# Patient Record
Sex: Female | Born: 1980 | Race: Black or African American | Hispanic: No | Marital: Single | Smoking: Current some day smoker
Health system: Southern US, Community
[De-identification: ages and names within clinical notes are randomized; demographics above are authoritative.]

## PROBLEM LIST (undated history)

## (undated) ENCOUNTER — Inpatient Hospital Stay (HOSPITAL_COMMUNITY): Payer: Self-pay

## (undated) DIAGNOSIS — A549 Gonococcal infection, unspecified: Secondary | ICD-10-CM

## (undated) DIAGNOSIS — Z789 Other specified health status: Secondary | ICD-10-CM

## (undated) DIAGNOSIS — A749 Chlamydial infection, unspecified: Secondary | ICD-10-CM

## (undated) DIAGNOSIS — B009 Herpesviral infection, unspecified: Secondary | ICD-10-CM

## (undated) HISTORY — PX: NO PAST SURGERIES: SHX2092

## (undated) HISTORY — DX: Herpesviral infection, unspecified: B00.9

## (undated) HISTORY — PX: LEG SURGERY: SHX1003

---
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
## 2005-08-29 ENCOUNTER — Inpatient Hospital Stay (HOSPITAL_COMMUNITY): Admission: EM | Admit: 2005-08-29 | Discharge: 2005-09-01 | Payer: Self-pay | Admitting: Emergency Medicine

## 2006-02-24 ENCOUNTER — Emergency Department (HOSPITAL_COMMUNITY): Admission: EM | Admit: 2006-02-24 | Discharge: 2006-02-24 | Payer: Self-pay | Admitting: Emergency Medicine

## 2006-10-29 ENCOUNTER — Emergency Department (HOSPITAL_COMMUNITY): Admission: EM | Admit: 2006-10-29 | Discharge: 2006-10-29 | Payer: Self-pay | Admitting: Emergency Medicine

## 2007-10-18 ENCOUNTER — Inpatient Hospital Stay (HOSPITAL_COMMUNITY): Admission: AD | Admit: 2007-10-18 | Discharge: 2007-10-18 | Payer: Self-pay | Admitting: Obstetrics and Gynecology

## 2008-07-08 ENCOUNTER — Inpatient Hospital Stay (HOSPITAL_COMMUNITY): Admission: AD | Admit: 2008-07-08 | Discharge: 2008-07-08 | Payer: Self-pay | Admitting: Family Medicine

## 2009-02-09 ENCOUNTER — Inpatient Hospital Stay (HOSPITAL_COMMUNITY): Admission: AD | Admit: 2009-02-09 | Discharge: 2009-02-13 | Payer: Self-pay | Admitting: Obstetrics

## 2010-09-05 ENCOUNTER — Emergency Department (HOSPITAL_COMMUNITY)
Admission: EM | Admit: 2010-09-05 | Discharge: 2010-09-05 | Disposition: A | Discharge status: Home / Self Care | Payer: No Typology Code available for payment source | Attending: Emergency Medicine | Admitting: Emergency Medicine

## 2010-09-05 ENCOUNTER — Emergency Department (HOSPITAL_COMMUNITY): Payer: Self-pay

## 2010-09-05 DIAGNOSIS — R079 Chest pain, unspecified: Secondary | ICD-10-CM | POA: Insufficient documentation

## 2010-09-05 DIAGNOSIS — M25519 Pain in unspecified shoulder: Secondary | ICD-10-CM | POA: Insufficient documentation

## 2010-09-05 DIAGNOSIS — M25539 Pain in unspecified wrist: Secondary | ICD-10-CM | POA: Insufficient documentation

## 2010-09-16 LAB — CBC
HCT: 26.4 % — ABNORMAL LOW (ref 36.0–46.0)
Hemoglobin: 8.8 g/dL — ABNORMAL LOW (ref 12.0–15.0)
RBC: 2.98 MIL/uL — ABNORMAL LOW (ref 3.87–5.11)
WBC: 10.4 10*3/uL (ref 4.0–10.5)

## 2010-09-17 LAB — CBC
HCT: 34.8 % — ABNORMAL LOW (ref 36.0–46.0)
Hemoglobin: 11.5 g/dL — ABNORMAL LOW (ref 12.0–15.0)
MCHC: 33 g/dL (ref 30.0–36.0)
MCV: 87.8 fL (ref 78.0–100.0)
RBC: 3.97 MIL/uL (ref 3.87–5.11)
RDW: 13 % (ref 11.5–15.5)

## 2010-09-26 LAB — RPR: RPR Ser Ql: NONREACTIVE

## 2010-09-26 LAB — URINALYSIS, ROUTINE W REFLEX MICROSCOPIC
Glucose, UA: NEGATIVE mg/dL
Hgb urine dipstick: NEGATIVE
Ketones, ur: NEGATIVE mg/dL
Protein, ur: 30 mg/dL — AB
pH: 6.5 (ref 5.0–8.0)

## 2010-09-26 LAB — CBC
Hemoglobin: 12.1 g/dL (ref 12.0–15.0)
MCHC: 33.4 g/dL (ref 30.0–36.0)
RBC: 4.14 MIL/uL (ref 3.87–5.11)
RDW: 12.5 % (ref 11.5–15.5)

## 2010-09-26 LAB — URINE MICROSCOPIC-ADD ON

## 2010-09-26 LAB — POCT PREGNANCY, URINE: Preg Test, Ur: POSITIVE

## 2010-09-26 LAB — ABO/RH: ABO/RH(D): A POS

## 2010-09-26 LAB — GC/CHLAMYDIA PROBE AMP, GENITAL: GC Probe Amp, Genital: NEGATIVE

## 2010-09-26 LAB — WET PREP, GENITAL: Trich, Wet Prep: NONE SEEN

## 2010-10-25 NOTE — Op Note (Signed)
Anna Calhoun, Anna Calhoun                ACCOUNT NO.:  [id number]   MEDICAL RECORD NO.:  [id number]          PATIENT TYPE:  INP   LOCATION:  9101                          [facility]ILITY[facility]  [facility]   PHYSICIAN:  Kathreen Cosier, M.D.DATE OF BIRTH:  [birth year]   DATE OF PROCEDURE:  02/10/2009  DATE OF DISCHARGE:                               OPERATIVE REPORT   PREOPERATIVE DIAGNOSES:  Failed induction, failure to progress in labor.   POSTOPERATIVE DIAGNOSES:  Failed induction, failure to progress in  labor.   ANESTHESIA:  Spinal.   SURGEON:  Kathreen Cosier, MD   PROCEDURE:  The patient placed in the operating table in supine  position.  Abdomen prepped and draped.  Bladder emptied with Foley  catheter.  Transverse suprapubic incision was made carried down to the  rectus fascia.  Fascia cleaned and incised length of the incision.  Recti muscles retracted laterally.  Peritoneum incised longitudinally.  Transverse incision made in the visceral peritoneum above the bladder.  Bladder mobilized inferiorly.  Transverse lower uterine incision made.  The patient delivered from the OP position of a female Apgar 9/9, weighing  7 pounds 13 ounces.  Team was in attendance.  The fluid was clear.  Placenta posterior removed manually and sent to labor and delivery.  Uterine cavity cleaned with dry laps.  Uterine incision closed in 1  layer with continuous suture of #1 chromic.  Hemostasis was  satisfactory.  Bladder flap reattached with 2-0 chromic.  Uterus well  contracted.  Tubes and ovaries normal.  Abdomen closed in layers,  peritoneum continuous suture of O chromic, fascia continuous suture of 0  Dexon.  Skin closed with subcuticular stitch of 4-0 Monocryl.  Blood  loss 600 mL.           ______________________________  Kathreen Cosier, M.D.     BAM/MEDQ  D:  02/10/2009  T:  02/11/2009  Job:  [id number]

## 2010-10-25 NOTE — H&P (Signed)
Anna Calhoun, Anna Calhoun                ACCOUNT NO.:  [id number]   MEDICAL RECORD NO.:  [id number]          PATIENT TYPE:  INP   LOCATION:  9101                          [facility]   PHYSICIAN:  Kathreen Cosier, M.D.DATE OF BIRTH:  August 28, 1980   DATE OF ADMISSION:  02/09/2009  DATE OF DISCHARGE:                              HISTORY & PHYSICAL   The patient is a 30 year old gravida 4, para 2-1-0-3, [facility] February 09, 2009 who desired induction.  She was started on low-dose Pitocin on the  night of February 09, 2009.  Cervix was 2 cm, 80% vertex, -2 to -3.  At  6:45 a.m. on September 1, membranes were ruptured artificially, fluid  clear, IUPC inserted.  During the course of the day, the patient was in  adequate labor and known was 4 cm, 90% vertex -1 to -2.  By 6:35 p.m.,  her cervix had been 6 cm with the vertex molded to -1 station for 4-1/2  hours and adequate labor.  Her largest previous baby was 5 pounds 13  ounces.  Estimated fetal weight with this pregnancy 7 pounds 6 ounces  and it was decided she would delivered by a C-section if failure to  progress in labor.  She was positive GBS.   PHYSICAL EXAMINATION:  GENERAL:  A well-developed female in labor.  HEENT: Negative.  LUNGS:  Clear.  HEART:  Regular rhythm.  No murmurs.  No gallops.  BREASTS:  No masses.  ABDOMEN:  Term sized uterus with estimated weight 7 pounds 6 ounces.  PELVIC:  As described above.  EXTREMITIES:  Negative.           ______________________________  Kathreen Cosier, M.D.     BAM/MEDQ  D:  02/10/2009  T:  02/11/2009  Job:  [id number]
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
## 2011-10-12 ENCOUNTER — Emergency Department (HOSPITAL_COMMUNITY)
Admission: EM | Admit: 2011-10-12 | Discharge: 2011-10-12 | Disposition: A | Discharge status: Home / Self Care | Payer: Self-pay | Attending: Emergency Medicine | Admitting: Emergency Medicine

## 2011-10-12 ENCOUNTER — Emergency Department (HOSPITAL_COMMUNITY): Payer: Self-pay

## 2011-10-12 ENCOUNTER — Encounter (HOSPITAL_COMMUNITY): Payer: Self-pay | Admitting: Adult Health

## 2011-10-12 DIAGNOSIS — R51 Headache: Secondary | ICD-10-CM | POA: Insufficient documentation

## 2011-10-12 DIAGNOSIS — F17200 Nicotine dependence, unspecified, uncomplicated: Secondary | ICD-10-CM | POA: Insufficient documentation

## 2011-10-12 DIAGNOSIS — H53459 Other localized visual field defect, unspecified eye: Secondary | ICD-10-CM | POA: Insufficient documentation

## 2011-10-12 DIAGNOSIS — H538 Other visual disturbances: Secondary | ICD-10-CM | POA: Insufficient documentation

## 2011-10-12 LAB — GLUCOSE, CAPILLARY: Glucose-Capillary: 75 mg/dL (ref 70–99)

## 2011-10-12 MED ORDER — HYDROCODONE-ACETAMINOPHEN 5-325 MG PO TABS
1.0000 | ORAL_TABLET | Freq: Once | ORAL | Status: AC
  Administered 2011-10-12: 1 via ORAL
  Filled 2011-10-12: qty 1

## 2011-10-12 MED ORDER — HYDROCODONE-ACETAMINOPHEN 5-325 MG PO TABS: ORAL_TABLET | ORAL | Status: AC

## 2011-10-12 NOTE — ED Notes (Signed)
cbg 75 mg/dL

## 2011-10-12 NOTE — ED Provider Notes (Signed)
History     CSN: [id number]  Arrival date & time 10/12/11  1722   First MD Initiated Contact with Patient 10/12/11 1810      No chief complaint on file.   (Consider location/radiation/quality/duration/timing/severity/associated sxs/prior treatment) HPI Comments: 31 yo female presents to the ED tonight with a chief complain of headaches.  States headaches have been occurring intermittently for the last year with bowel movements and onset of menses with worsening over the past week. They have become more intense and more frequent. Pain is constant and severe in the back of the head.  Describes the pain as a pressure.   Also notes black dots in vision, blurred vision, distortion of objects in peripheral vision.  Denies aggravation of pain with movement of neck or upper extremities.  Denies weakness, tingling of the extremities, gait disturbances.  Has tried aleve to decrease pain.  This usually works for the pain, but has not in the past week.  No recent illness.  Denies fever, n/v, photophobia, neck stiffness. She denies difficulty walking. She denies head injury. Patient is a 31 y.o. female presenting with headaches. The history is provided by the patient.  Headache  This is a new problem. The current episode started more than 1 week ago. Progression since onset: Becoming more frequent. Associated with: Bowel movements. The pain is located in the occipital region. The quality of the pain is described as dull. Pertinent negatives include no fever, no shortness of breath, no nausea and no vomiting. She has tried NSAIDs for the symptoms. The treatment provided significant relief.    History reviewed. No pertinent past medical history.  History reviewed. No pertinent past surgical history.  History reviewed. No pertinent family history.  History  Substance Use Topics  . Smoking status: Current Everyday Smoker  . Smokeless tobacco: Not on file  . Alcohol Use: Yes    OB History    Grav Para  Term Preterm Abortions TAB SAB Ect Mult Living                  Review of Systems  Constitutional: Negative for fever, chills and fatigue.  HENT: Negative for congestion, rhinorrhea, neck pain, neck stiffness, sinus pressure and tinnitus.   Eyes: Positive for visual disturbance. Negative for photophobia and pain.  Respiratory: Negative for shortness of breath.   Cardiovascular: Negative for chest pain.  Gastrointestinal: Negative for nausea, vomiting and diarrhea.  Musculoskeletal: Negative for back pain and gait problem.  Skin: Negative for wound.  Neurological: Positive for headaches. Negative for dizziness, syncope, speech difficulty, weakness, light-headedness and numbness.  Psychiatric/Behavioral: Negative for confusion and decreased concentration.    Allergies  Review of patient's allergies indicates no known allergies.  Home Medications  No current outpatient prescriptions on file.  BP 116/79  Pulse 76  Temp(Src) 98 F (36.7 C) (Oral)  Resp 20  SpO2 100%  Physical Exam  Nursing note and vitals reviewed. Constitutional: She is oriented to person, place, and time. She appears well-developed and well-nourished. No distress.  HENT:  Head: Normocephalic and atraumatic.  Right Ear: External ear normal.  Left Ear: External ear normal.  Nose: Nose normal.  Mouth/Throat: Oropharynx is clear and moist.  Eyes: Conjunctivae and EOM are normal. Pupils are equal, round, and reactive to light. Right eye exhibits no discharge. Left eye exhibits no discharge.  Fundoscopic exam:      The right eye shows no arteriolar narrowing, no hemorrhage and no papilledema.  The left eye shows no arteriolar narrowing, no hemorrhage and no papilledema.  Neck: Normal range of motion. Neck supple.       No meningeal signs  Cardiovascular: Normal rate, regular rhythm and normal heart sounds.   No murmur heard. Pulmonary/Chest: Effort normal and breath sounds normal. No respiratory distress.    Musculoskeletal:       5/5 strength in bilateral upper extremities.  No tenderness upon palpation of cervical vertebrae or paraspinal muscles.  Lymphadenopathy:    She has no cervical adenopathy.  Neurological: She is alert and oriented to person, place, and time. She has normal reflexes. No cranial nerve deficit. Coordination normal.       Normal sensation. Normal gait.  Skin: Skin is warm and dry.  Psychiatric: She has a normal mood and affect.    ED Course  Procedures (including critical care time)   Labs Reviewed  GLUCOSE, CAPILLARY   Ct Head Wo Contrast  10/12/2011  *RADIOLOGY REPORT*  Clinical Data: Headache and visual changes.  CT HEAD WITHOUT CONTRAST  Technique:  Contiguous axial images were obtained from the base of the skull through the vertex without contrast.  Comparison: None  Findings: The ventricles are normal.  No extra-axial fluid collections are seen.  The brainstem and cerebellum are unremarkable.  No acute intracranial findings such as infarction or hemorrhage.  No mass lesions.  The bony calvarium is intact.  The visualized paranasal sinuses and mastoid air cells are clear.  IMPRESSION: No acute intracranial findings or mass lesion.  Original Report Authenticated By: P. Loralie Champagne, M.D.     1. Headache     6:43 PM Patient seen and examined. Work-up initiated.   Vital signs reviewed and are as follows: Filed Vitals:   10/12/11 1742  BP: 116/79  Pulse: 76  Temp: 98 F (36.7 C)  Resp: 20   Will check head CT given worsening headaches, more frequent headaches and change in vision. Also patient does not have routine primary care followup. If this is negative will refer to ophthalmologist and to neurologist and have patient follow up accordingly. She is comfortable with this plan.  8:17 PM patient reexamined. Exam unchanged. Informed of CT results. Will discharge to home with the previously mentioned referrals. Patient given hydrocodone in emergency  department for headache. She appears well. She agrees with plan.  Patient counseled on use of narcotic pain medications. Counseled not to combine these medications with others containing tylenol. Urged not to drink alcohol, drive, or perform any other activities that requires focus while taking these medications. The patient verbalizes understanding and agrees with the plan.    MDM  Headache, CT is negative. Appropriate referrals given. Strict return instructions given.        Renne Crigler, [place] 10/12/11 2019

## 2011-10-12 NOTE — ED Notes (Signed)
PA student at bedside.

## 2011-10-12 NOTE — ED Notes (Signed)
C/o of head pain in occiput are of head for one year. C/o blurred eyesight and black floaters for one month. Denies dizziness, denies nausea, denies feeling off balance. Pt is alert and oriented. States over the counter pain medication is not working.

## 2011-10-12 NOTE — Discharge Instructions (Signed)
Please read and follow all provided instructions.  Your diagnoses today include:  1. Headache     Tests performed today include:  CT of your head which was normal and did not show any serious cause of your headache  Vital signs. See below for your results today.   Medications:  You have been prescribed:  Vicodin (hydrocodone/acetaminophen) - narcotic pain medication  You have been prescribed narcotic pain medication such as Vicodin or Percocet: DO NOT drive or perform any activities that require you to be awake and alert because this medicine can make you drowsy. BE VERY CAREFUL not to take multiple medicines containing Tylenol (also called acetaminophen). Doing so can lead to an overdose which can damage your liver and cause liver failure and possibly death.    Take any prescribed medications only as directed.  Additional information:  Follow any educational materials contained in this packet.  You are having a headache. No specific cause was found today for your headache. It may have been a migraine or other cause of headache. Stress, anxiety, fatigue, and depression are common triggers for headaches.  Your headache today does not appear to be life-threatening or require hospitalization, but often the exact cause of headaches is not determined in the emergency department. Therefore, follow-up with your doctor is very important to find out what may have caused your headache and whether or not you need any further diagnostic testing or treatment.   Sometimes headaches can appear benign (not harmful), but then more serious symptoms can develop which should prompt an immediate re-evaluation by your doctor or the emergency department.  BE VERY CAREFUL not to take multiple medicines containing Tylenol (also called acetaminophen). Doing so can lead to an overdose which can damage your liver and cause liver failure and possibly death.   Follow-up instructions: Please follow-up with your  primary care provider in the next 3 days for further evaluation of your symptoms.  Follow-up with the eye specialist and neurologist listed for further evaluation of your problems.   If you do not have a primary care doctor -- see below for referral information.   Return instructions:   Please return to the Emergency Department if you experience worsening symptoms.  Return if the medications do not resolve your headache, if it recurs, or if you have multiple episodes of vomiting or cannot keep down fluids.  Return if you have a change from the usual headache.  RETURN IMMEDIATELY IF you:  Develop a sudden, severe headache  Develop confusion or become poorly responsive or faint  Develop a fever above 100.85F or problem breathing  Have a change in speech, vision, swallowing, or understanding  Develop new weakness, numbness, tingling, incoordination in your arms or legs  Have a seizure  Please return if you have any other emergent concerns.  Additional Information:  Your vital signs today were: BP 104/70  Pulse 64  Temp(Src) 98 F (36.7 C) (Oral)  Resp 16  SpO2 100%  LMP 10/03/2011 If your blood pressure (BP) was elevated above 135/85 this visit, please have this repeated by your doctor within one month. -------------- No Primary Care Doctor Call [facility]  [phone] Other agencies that provide inexpensive medical care    [facility]  [phone]    [facility]  [phone]    [facility]  [phone]    [facility]  [phone]    [facility]  [phone]    [facility]  [phone] -------------- RESOURCE  GUIDE:  Dental Problems  Patients with Medicaid: [facility] [phone] [address]                                            [phone] [address] Phone:  [phone]                                                   Phone:  [phone]  If unable to pay or uninsured,  contact:  [facility] or [facility]. to become qualified for the adult dental clinic.  Chronic Pain Problems Contact [facility]  [phone] Patients need to be referred by their primary care doctor.  Insufficient Money for Medicine Contact [employer]:  call "211" or [facility] [phone].  Psychological Services [facility]  [phone] [facility]  [phone] [facility]   [phone] (emergency services [phone])  Substance Abuse Resources [facility]  [phone] [facility] [phone] [facility] [place] [phone] Floydene Flock [phone] Residential & Outpatient Substance Abuse Program  [phone]  Abuse/Neglect [facility] Child Abuse Hotline ([phone] [facility] Child Abuse Hotline 469-172-1761 (After Hours)  Emergency Shelter [facility] [phone]  Maternity Homes Room at the [place] [facility] [phone] [facility] [phone]  [facility] Resources  [facility]     [employer]                          [facility] Dept. [address] [place]                       [address]      [address]  Blondell Reveal Phone:  [phone]                                   Phone:  [phone]                 Phone:  [phone]  [facility] Phone:  [phone]  [facility] Child Abuse Hotline 802-139-1139 [phone] (After Hours)

## 2011-10-12 NOTE — ED Provider Notes (Signed)
Medical screening examination/treatment/procedure(s) were performed by non-physician practitioner and as supervising physician I was immediately available for consultation/collaboration.   Ellaree Gear, MD 10/12/11 2359 

## 2012-06-12 ENCOUNTER — Emergency Department (HOSPITAL_COMMUNITY): Payer: Self-pay

## 2012-06-12 ENCOUNTER — Encounter (HOSPITAL_COMMUNITY): Payer: Self-pay | Admitting: *Deleted

## 2012-06-12 ENCOUNTER — Emergency Department (HOSPITAL_COMMUNITY)
Admission: EM | Admit: 2012-06-12 | Discharge: 2012-06-12 | Disposition: A | Discharge status: Home / Self Care | Payer: Self-pay | Attending: Emergency Medicine | Admitting: Emergency Medicine

## 2012-06-12 DIAGNOSIS — Z8659 Personal history of other mental and behavioral disorders: Secondary | ICD-10-CM | POA: Insufficient documentation

## 2012-06-12 DIAGNOSIS — Z23 Encounter for immunization: Secondary | ICD-10-CM | POA: Insufficient documentation

## 2012-06-12 DIAGNOSIS — R45851 Suicidal ideations: Secondary | ICD-10-CM | POA: Insufficient documentation

## 2012-06-12 DIAGNOSIS — F1010 Alcohol abuse, uncomplicated: Secondary | ICD-10-CM | POA: Insufficient documentation

## 2012-06-12 DIAGNOSIS — F1410 Cocaine abuse, uncomplicated: Secondary | ICD-10-CM | POA: Insufficient documentation

## 2012-06-12 DIAGNOSIS — IMO0002 Reserved for concepts with insufficient information to code with codable children: Secondary | ICD-10-CM | POA: Insufficient documentation

## 2012-06-12 DIAGNOSIS — F17200 Nicotine dependence, unspecified, uncomplicated: Secondary | ICD-10-CM | POA: Insufficient documentation

## 2012-06-12 DIAGNOSIS — Z331 Pregnant state, incidental: Secondary | ICD-10-CM | POA: Insufficient documentation

## 2012-06-12 DIAGNOSIS — X838XXA Intentional self-harm by other specified means, initial encounter: Secondary | ICD-10-CM | POA: Insufficient documentation

## 2012-06-12 LAB — CBC
HCT: 36.5 % (ref 36.0–46.0)
Hemoglobin: 12.1 g/dL (ref 12.0–15.0)
MCV: 83.1 fL (ref 78.0–100.0)
RDW: 13.3 % (ref 11.5–15.5)
WBC: 11.1 10*3/uL — ABNORMAL HIGH (ref 4.0–10.5)

## 2012-06-12 LAB — URINALYSIS, ROUTINE W REFLEX MICROSCOPIC
Bilirubin Urine: NEGATIVE
Ketones, ur: NEGATIVE mg/dL
Nitrite: NEGATIVE
Protein, ur: NEGATIVE mg/dL
pH: 5.5 (ref 5.0–8.0)

## 2012-06-12 LAB — RAPID URINE DRUG SCREEN, HOSP PERFORMED
Amphetamines: NOT DETECTED
Barbiturates: NOT DETECTED
Benzodiazepines: NOT DETECTED

## 2012-06-12 LAB — HCG, QUANTITATIVE, PREGNANCY: hCG, Beta Chain, Quant, S: 31425 m[IU]/mL — ABNORMAL HIGH (ref <5)

## 2012-06-12 LAB — BASIC METABOLIC PANEL
BUN: 7 mg/dL (ref 6–23)
CO2: 20 mEq/L (ref 19–32)
Chloride: 104 mEq/L (ref 96–112)
Creatinine, Ser: 0.75 mg/dL (ref 0.50–1.10)
Glucose, Bld: 91 mg/dL (ref 70–99)
Potassium: 3.5 mEq/L (ref 3.5–5.1)

## 2012-06-12 LAB — ETHANOL: Alcohol, Ethyl (B): 206 mg/dL — ABNORMAL HIGH (ref 0–11)

## 2012-06-12 LAB — URINE MICROSCOPIC-ADD ON

## 2012-06-12 LAB — ABO/RH: ABO/RH(D): A POS

## 2012-06-12 LAB — ACETAMINOPHEN LEVEL: Acetaminophen (Tylenol), Serum: <15 ug/mL (ref 10–30)

## 2012-06-12 MED ORDER — SODIUM CHLORIDE 0.9 % IV SOLN
INTRAVENOUS | Status: DC
  Administered 2012-06-12: 06:00:00 via INTRAVENOUS

## 2012-06-12 MED ORDER — TETANUS-DIPHTH-ACELL PERTUSSIS 5-2.5-18.5 LF-MCG/0.5 IM SUSP
0.5000 mL | Freq: Once | INTRAMUSCULAR | Status: AC
  Administered 2012-06-12: 0.5 mL via INTRAMUSCULAR
  Filled 2012-06-12: qty 0.5

## 2012-06-12 MED ORDER — ONDANSETRON HCL 4 MG/2ML IJ SOLN
4.0000 mg | Freq: Once | INTRAMUSCULAR | Status: AC
  Administered 2012-06-12: 4 mg via INTRAVENOUS
  Filled 2012-06-12: qty 2

## 2012-06-12 MED ORDER — BACITRACIN ZINC 500 UNIT/GM EX OINT
TOPICAL_OINTMENT | CUTANEOUS | Status: AC
  Administered 2012-06-12: 1
  Filled 2012-06-12: qty 0.9
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
## 2012-06-12 NOTE — ED Provider Notes (Signed)
This chart was scribed for Glynn Octave, MD by Shari Heritage, ED Scribe. The patient was seen in room WA15/WA15.  2:15 PM - Patient's pregnancy test was positive. Patient states that she is G5. LMP was sometime last month. Patient denies abdominal pain, abnormal vaginal bleeding or back pain. She complains of some dizziness and mild headache at this time. Patient vehemently denies any other pain. She denies SI or HI. Upon exam, abdomen was soft and nontender.   PREGNANCY, URINE - Abnormal; Notable for the following:    Preg Test, Ur POSITIVE (*)      Psychiatry consult is complete. She does not meet inpatient criteria. She is not suicidal or homicidal. 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 Referral made to child protective services by social worker on call Lovette Cliche.  IUP confirmed on [place].  Awaiting CPS evaluation at time of sign out.  I personally performed the services described in this documentation, which was scribed in my presence. The recorded information has been reviewed and is accurate.    Glynn Octave, MD 06/12/12 [phone]

## 2012-06-12 NOTE — ED Notes (Signed)
Tele-psych paperwork faxed

## 2012-06-12 NOTE — ED Provider Notes (Signed)
History     CSN: [id number]  Arrival date & time 06/12/12  0445   First MD Initiated Contact with Patient 06/12/12 0500      Chief Complaint  Patient presents with  . Medical Clearance    (Consider location/radiation/quality/duration/timing/severity/associated sxs/prior treatment) HPI History provided by patient and EMS. Suicidal ideation with superficial lacerations to both wrists. Patient is to alcohol use and previous cocaine use. Has history of anxiety and depression and previous suicide attempts.  Patient became combative with EMS prior to arrival and was given Haldol in route.  In emergency department, she provides limited history, denies any current suicidal ideation. She denies any other ingestions tonight. No weakness or numbness in her hands. Bleeding controlled prior to arrival. Mod in severity.  History reviewed. No pertinent past medical history.  History reviewed. No pertinent past surgical history.  No family history on file.  History  Substance Use Topics  . Smoking status: Current Every Day Smoker  . Smokeless tobacco: Not on file  . Alcohol Use: Yes     Comment: varies    OB History    Grav Para Term Preterm Abortions TAB SAB Ect Mult Living                  Review of Systems  Constitutional: Negative for fever and chills.  HENT: Negative for neck pain and neck stiffness.   Eyes: Negative for visual disturbance.  Respiratory: Negative for shortness of breath.   Cardiovascular: Negative for chest pain.  Gastrointestinal: Negative for abdominal pain.  Genitourinary: Negative for dysuria.  Musculoskeletal: Negative for back pain.  Skin: Negative for rash.  Neurological: Negative for headaches.  Psychiatric/Behavioral: Positive for self-injury.  All other systems reviewed and are negative.    Allergies  Review of patient's allergies indicates no known allergies.  Home Medications   Current Outpatient Rx  Name  Route  Sig  Dispense  Refill  .  NAPROXEN SODIUM 220 MG PO TABS   Oral   Take 440 mg by mouth every 8 (eight) hours as needed. For pain.           BP 118/73  Pulse 111  Temp 97.5 F (36.4 C) (Oral)  Resp 16  SpO2 96%  LMP 03/27/2012  Physical Exam  Constitutional: She is oriented to person, place, and time. She appears well-developed and well-nourished.  HENT:  Head: Normocephalic and atraumatic.  Eyes: EOM are normal. Pupils are equal, round, and reactive to light. No scleral icterus.  Neck: Normal range of motion. Neck supple.  Cardiovascular: Normal rate, regular rhythm and intact distal pulses.   Pulmonary/Chest: Effort normal and breath sounds normal. No respiratory distress.  Musculoskeletal: Normal range of motion. She exhibits no edema.       Superficial lacerations to both wrists, hemostatic. Distal neurovascular intact x4.  Neurological: She is alert and oriented to person, place, and time.  Skin: Skin is warm and dry.    ED Course  Procedures (including critical care time)  Results for orders placed during the hospital encounter of 06/12/12  CBC      Component Value Range   WBC 11.1 (*) 4.0 - 10.5 K/uL   RBC 4.39  3.87 - 5.11 MIL/uL   Hemoglobin 12.1  12.0 - 15.0 g/dL   HCT 40.9  81.1 - 91.4 %   MCV 83.1  78.0 - 100.0 fL   MCH 27.6  26.0 - 34.0 pg   MCHC 33.2  30.0 - 36.0 g/dL  RDW 13.3  11.5 - 15.5 %   Platelets 298  150 - 400 K/uL  BASIC METABOLIC PANEL      Component Value Range   Sodium 136  135 - 145 mEq/L   Potassium 3.5  3.5 - 5.1 mEq/L   Chloride 104  96 - 112 mEq/L   CO2 20  19 - 32 mEq/L   Glucose, Bld 91  70 - 99 mg/dL   BUN 7  6 - 23 mg/dL   Creatinine, Ser 1.61  0.50 - 1.10 mg/dL   Calcium 9.8  8.4 - 09.6 mg/dL   GFR calc non Af Amer >90  >90 mL/min   GFR calc Af Amer >90  >90 mL/min  ETHANOL      Component Value Range   Alcohol, Ethyl (B) 206 (*) 0 - 11 mg/dL  ACETAMINOPHEN LEVEL      Component Value Range   Acetaminophen (Tylenol), Serum <15.0  10 - 30 ug/mL    SALICYLATE LEVEL      Component Value Range   Salicylate Lvl <2.0 (*) 2.8 - 20.0 mg/dL    MDM  Alcohol intoxication with suicidal thoughts and superficial abrasions to wrists  Labs reviewed as above. Plan sober and Psych Dispo        Sunnie Nielsen, MD 06/12/12 [phone]

## 2012-06-12 NOTE — ED Notes (Signed)
ION:GEXB<MW> Expected date:06/12/12<BR> Expected time: 4:31 AM<BR> Means of arrival:Ambulance<BR> Comments:<BR> Suicidal/superficial lacs

## 2012-06-12 NOTE — ED Notes (Signed)
RUE:AV40<JW> Expected date:<BR> Expected time:<BR> Means of arrival:<BR> Comments:<BR> For RES B

## 2012-06-12 NOTE — ED Notes (Signed)
Pt made aware of need for urine specimen. Unable to provide at this time. Will continue to monitor

## 2012-08-08 ENCOUNTER — Encounter (HOSPITAL_COMMUNITY): Payer: Self-pay | Admitting: *Deleted

## 2012-08-08 ENCOUNTER — Inpatient Hospital Stay (HOSPITAL_COMMUNITY)
Admission: AD | Admit: 2012-08-08 | Discharge: 2012-08-08 | Disposition: A | Discharge status: Home / Self Care | Payer: Self-pay | Source: Ambulatory Visit | Attending: Obstetrics & Gynecology | Admitting: Obstetrics & Gynecology

## 2012-08-08 DIAGNOSIS — O99891 Other specified diseases and conditions complicating pregnancy: Secondary | ICD-10-CM | POA: Insufficient documentation

## 2012-08-08 DIAGNOSIS — N949 Unspecified condition associated with female genital organs and menstrual cycle: Secondary | ICD-10-CM

## 2012-08-08 DIAGNOSIS — R109 Unspecified abdominal pain: Secondary | ICD-10-CM | POA: Insufficient documentation

## 2012-08-08 HISTORY — DX: Gonococcal infection, unspecified: A54.9

## 2012-08-08 HISTORY — DX: Chlamydial infection, unspecified: A74.9

## 2012-08-08 LAB — URINE MICROSCOPIC-ADD ON

## 2012-08-08 LAB — URINALYSIS, ROUTINE W REFLEX MICROSCOPIC
Nitrite: NEGATIVE
Specific Gravity, Urine: 1.02 (ref 1.005–1.030)
Urobilinogen, UA: 0.2 mg/dL (ref 0.0–1.0)
pH: 6.5 (ref 5.0–8.0)

## 2012-08-08 LAB — WET PREP, GENITAL: Yeast Wet Prep HPF POC: NONE SEEN

## 2012-08-08 NOTE — MAU Note (Signed)
Patient states she had lower abdominal pain for 3 days, constant. Denies bleeding but does have a milky white discharge. Has a small bump on the labia that itches and is painful that started yesterday.

## 2012-08-08 NOTE — MAU Provider Note (Signed)
History     CSN: [id number]  Arrival date and time: 08/08/12 1320   None     Chief Complaint  Patient presents with  . Abdominal Pain   HPI  Anna Calhoun is a 32 y.o. female @ [redacted]w[redacted]d  gestation who presents to MAU with abdominal pain. The pain is located in the lower abdomen on both sides. The pain started 2 days ago. She describes the pain as cramping. The pain is worse with walking and movement. Pain is better with lying and resting.  She rates the pain as 7/10. Has not started prenatal care because she had thought at first she would terminate the pregnancy but now has decided to keep the baby. Has just recently applied for pregnancy but was told she had to come and get pregnancy verification. Last pap smear 2012 and was normal. Current sex partner x 5 years. Hx of GC and Chlamydia. The history was provided by the patient.    OB History   Grav Para Term Preterm Abortions TAB SAB Ect Mult Living   5 4 3 1  0     4      Past Medical History  Diagnosis Date  . Gonorrhea 1997  . Chlamydia 1997    Past Surgical History  Procedure Laterality Date  . Cesarean section      Family History  Problem Relation Age of Onset  . Hypertension Mother   . Diabetes Father   . Cancer Maternal Grandfather   . Cancer Paternal Grandmother     History  Substance Use Topics  . Smoking status: Current Every Day Smoker  . Smokeless tobacco: Not on file  . Alcohol Use: Yes     Comment: varies not since pregnancy    Allergies: No Known Allergies  Prescriptions prior to admission  Medication Sig Dispense Refill  . acetaminophen (TYLENOL) 325 MG tablet Take 650 mg by mouth every 6 (six) hours as needed for pain (For headache).      . Prenatal Vit-Fe Fumarate-FA (PRENATAL MULTIVITAMIN) TABS Take 1 tablet by mouth daily at 12 noon.      Marland Kitchen tetrahydrozoline (VISINE) 0.05 % ophthalmic solution Place 1 drop into both eyes once as needed (For redness, burning).        Review of Systems   Constitutional: Negative for fever, chills, weight loss and malaise/fatigue.  HENT: Negative for ear pain, nosebleeds, congestion and sore throat.   Eyes: Negative for blurred vision, double vision and photophobia.  Respiratory: Negative for cough, shortness of breath and wheezing.   Cardiovascular: Negative for chest pain and palpitations.  Gastrointestinal: Positive for heartburn and abdominal pain. Negative for nausea, vomiting, diarrhea and constipation.  Genitourinary: Negative for dysuria, urgency, frequency and flank pain.       No vaginal bleeding but there is vaginal discharge.  Musculoskeletal: Negative for back pain.  Skin: Negative.   Neurological: Positive for headaches. Negative for dizziness and seizures.  Psychiatric/Behavioral: Negative for depression and substance abuse. The patient does not have insomnia.    Blood pressure 108/64, pulse 86, temperature 98 F (36.7 C), temperature source Oral, resp. rate 16, height 5\' 1"  (1.549 m), weight 190 lb 3.2 oz (86.274 kg), last menstrual period 04/27/2012, SpO2 100.00%.  Physical Exam  Constitutional: She is oriented to person, place, and time. She appears well-developed and well-nourished. No distress.  HENT:  Head: Normocephalic and atraumatic.  Eyes: EOM are normal.  Neck: Neck supple.  Cardiovascular: Normal rate.   Respiratory: Effort normal.  GI: Soft. There is no tenderness.  Unable to reproduce the pain the patient has had. Positive FHT's  Genitourinary:  External genitalia with lesion on right labia. Tender with palpation. White thin discharge vaginal vault. Cervix closed, thick. Uterus larger than dates.  Musculoskeletal: Normal range of motion.  Neurological: She is alert and oriented to person, place, and time.  Skin: Skin is warm and dry.  Psychiatric: She has a normal mood and affect. Her behavior is normal. Judgment and thought content normal.   Procedures  Assessment: 32 y.o. female @ [redacted]w[redacted]d gestation  with abdominal pain   Round ligament pain   Lesion right labia cultured for HSV  Plan:  Tylenol prn   Cultures pending   Follow up with OB    Medication List    TAKE these medications       acetaminophen 325 MG tablet  Commonly known as:  TYLENOL  Take 650 mg by mouth every 6 (six) hours as needed for pain (For headache).     prenatal multivitamin Tabs  Take 1 tablet by mouth daily at 12 noon.     VISINE 0.05 % ophthalmic solution  Generic drug:  tetrahydrozoline  Place 1 drop into both eyes once as needed (For redness, burning).        Hue Steveson, RN, FNP, [facility] 08/08/2012, 3:06 PM   16:30 pm 08/12/12 Patient called. 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 She states that the lesion is healing and no painful. I discussed in detail with the patient need for her OB to know about the positive HSV. Discussed possible problems in pregnancy associated with genital herpes. Patient voices understanding. She plan care with Dr. Gaynell Face.

## 2012-08-09 LAB — GC/CHLAMYDIA PROBE AMP
CT Probe RNA: NEGATIVE
GC Probe RNA: NEGATIVE

## 2012-08-12 LAB — HERPES SIMPLEX VIRUS CULTURE

## 2012-08-12 NOTE — MAU Provider Note (Signed)
Chart reviewed and agree with management and plan.  

## 2012-08-14 ENCOUNTER — Encounter: Payer: Self-pay | Admitting: Obstetrics & Gynecology

## 2012-08-14 ENCOUNTER — Telehealth: Payer: Self-pay | Admitting: *Deleted

## 2012-08-14 DIAGNOSIS — B009 Herpesviral infection, unspecified: Secondary | ICD-10-CM | POA: Insufficient documentation

## 2012-08-14 DIAGNOSIS — O98519 Other viral diseases complicating pregnancy, unspecified trimester: Secondary | ICD-10-CM

## 2012-08-14 NOTE — Telephone Encounter (Signed)
Patient returned call, notified her of positive HSV II result.  Patient stated she had already been notified.  Patient plans to start prenatal care with Dr. Gaynell Face when her Medicaid gets approved and she will follow up with him regarding the Valtrex at 35 weeks.

## 2012-08-14 NOTE — Telephone Encounter (Signed)
Called pt to inform her of test results- spoke w/a female who gave alternate tel #  [phone].  Prior to calling this number I observed documentation stating that pt was notified of results and implications to pregnancy on 08/12/12 by Kerrie Buffalo NP.  I therefore did not call pt. Pt plans prenatal care w/Dr. Gaynell Face.

## 2012-08-14 NOTE — Telephone Encounter (Signed)
Message copied by Jill Side on Wed Aug 14, 2012  2:53 PM ------      Message from: Lesly Dukes      Created: Wed Aug 14, 2012 10:00 AM       Pt has HSV 2.  She needs to be called and told of her diagnosis.  She will need Valtrex at 35 weeks to prevent recurrence ------

## 2012-10-12 ENCOUNTER — Inpatient Hospital Stay (HOSPITAL_COMMUNITY)
Admission: AD | Admit: 2012-10-12 | Discharge: 2012-10-12 | Disposition: A | Discharge status: Home / Self Care | Payer: Medicaid Other | Source: Ambulatory Visit | Attending: Obstetrics | Admitting: Obstetrics

## 2012-10-12 ENCOUNTER — Encounter (HOSPITAL_COMMUNITY): Payer: Self-pay

## 2012-10-12 DIAGNOSIS — Z3490 Encounter for supervision of normal pregnancy, unspecified, unspecified trimester: Secondary | ICD-10-CM

## 2012-10-12 DIAGNOSIS — M79609 Pain in unspecified limb: Secondary | ICD-10-CM | POA: Insufficient documentation

## 2012-10-12 DIAGNOSIS — M5430 Sciatica, unspecified side: Secondary | ICD-10-CM

## 2012-10-12 DIAGNOSIS — O99891 Other specified diseases and conditions complicating pregnancy: Secondary | ICD-10-CM | POA: Insufficient documentation

## 2012-10-12 DIAGNOSIS — M5431 Sciatica, right side: Secondary | ICD-10-CM

## 2012-10-12 HISTORY — DX: Other specified health status: Z78.9

## 2012-10-12 LAB — URINE MICROSCOPIC-ADD ON

## 2012-10-12 LAB — URINALYSIS, ROUTINE W REFLEX MICROSCOPIC
Bilirubin Urine: NEGATIVE
Glucose, UA: NEGATIVE mg/dL
Hgb urine dipstick: NEGATIVE
Ketones, ur: NEGATIVE mg/dL
Protein, ur: NEGATIVE mg/dL

## 2012-10-12 NOTE — MAU Note (Signed)
Patient describes pain on right side around to back difficult to lift leg.

## 2012-10-12 NOTE — MAU Provider Note (Signed)
History     CSN: [id number]  Arrival date & time 10/12/12  1453   None     Chief Complaint  Patient presents with  . Abdominal Pain    (Consider location/radiation/quality/duration/timing/severity/associated sxs/prior treatment) HPI Anna Calhoun is a 32 y.o. [id number] at [redacted]w[redacted]d. She presents with c/o pain in her R hip/buttock that radiates down her leg. She has had it for months, is getting worse. Sometimes has trouble moving it. Take Tylenol, not much help. She does not exercise or do any stretches. She denies any contraction, leaking or bleeding. Good fetal activity.  Past Medical History  Diagnosis Date  . Gonorrhea 1997  . Chlamydia 1997  . Medical history non-contributory     Past Surgical History  Procedure Laterality Date  . Cesarean section    . No past surgeries      Family History  Problem Relation Age of Onset  . Hypertension Mother   . Diabetes Father   . Cancer Maternal Grandfather   . Cancer Paternal Grandmother     History  Substance Use Topics  . Smoking status: Current Every Day Smoker  . Smokeless tobacco: Not on file  . Alcohol Use: Yes     Comment: varies not since pregnancy    OB History   Grav Para Term Preterm Abortions TAB SAB Ect Mult Living   5 4 3 1  0     4      Review of Systems  Genitourinary: Negative for dysuria, urgency, frequency, flank pain, vaginal bleeding and vaginal discharge.  Musculoskeletal: Positive for myalgias.    Allergies  Review of patient's allergies indicates no known allergies.  Home Medications  No current outpatient prescriptions on file.  BP 124/69  Pulse 122  Temp(Src) 99.1 F (37.3 C) (Oral)  Resp 18  LMP 04/27/2012  Physical Exam  Constitutional: She is oriented to person, place, and time. She appears well-developed and well-nourished.  Abdominal: Soft. There is no tenderness.  Musculoskeletal: Normal range of motion. She exhibits no edema and no tenderness.  Neurological: She is alert  and oriented to person, place, and time. She has normal reflexes.  Skin: Skin is warm and dry.  Psychiatric: She has a normal mood and affect. Her behavior is normal.    ED Course  Procedures (including critical care time)  Labs Reviewed  URINALYSIS, ROUTINE W REFLEX MICROSCOPIC - Abnormal; Notable for the following:    APPearance HAZY (*)    Leukocytes, UA SMALL (*)    All other components within normal limits  URINE MICROSCOPIC-ADD ON - Abnormal; Notable for the following:    Squamous Epithelial / LPF FEW (*)    Bacteria, UA FEW (*)    All other components within normal limits  URINE CULTURE   No results found.   No diagnosis found.    MDM  ASSESSMENT: Sciatica, worse due to pregnancy  PLAN: Encouraged stretching/ mild exercise Heat Tylenol Will make an appt with Dr Gaynell Face when gets Mediciad card

## 2012-10-13 LAB — URINE CULTURE

## 2012-11-05 LAB — OB RESULTS CONSOLE ABO/RH: RH Type: POSITIVE

## 2012-11-05 LAB — OB RESULTS CONSOLE HEPATITIS B SURFACE ANTIGEN: Hepatitis B Surface Ag: NEGATIVE

## 2012-11-05 LAB — OB RESULTS CONSOLE HIV ANTIBODY (ROUTINE TESTING): HIV: NONREACTIVE

## 2012-11-05 LAB — OB RESULTS CONSOLE RPR: RPR: NONREACTIVE

## 2012-11-05 LAB — OB RESULTS CONSOLE GC/CHLAMYDIA: Gonorrhea: NEGATIVE

## 2013-01-01 ENCOUNTER — Other Ambulatory Visit (HOSPITAL_COMMUNITY): Payer: Self-pay | Admitting: Obstetrics

## 2013-01-01 DIAGNOSIS — Z0489 Encounter for examination and observation for other specified reasons: Secondary | ICD-10-CM

## 2013-01-08 ENCOUNTER — Ambulatory Visit (HOSPITAL_COMMUNITY)
Admission: RE | Admit: 2013-01-08 | Discharge: 2013-01-08 | Disposition: A | Discharge status: Home / Self Care | Payer: Medicaid Other | Source: Ambulatory Visit | Attending: Obstetrics | Admitting: Obstetrics

## 2013-01-08 DIAGNOSIS — Z0489 Encounter for examination and observation for other specified reasons: Secondary | ICD-10-CM

## 2013-01-08 DIAGNOSIS — O358XX0 Maternal care for other (suspected) fetal abnormality and damage, not applicable or unspecified: Secondary | ICD-10-CM | POA: Insufficient documentation

## 2013-01-08 DIAGNOSIS — Z363 Encounter for antenatal screening for malformations: Secondary | ICD-10-CM | POA: Insufficient documentation

## 2013-01-08 DIAGNOSIS — Z1389 Encounter for screening for other disorder: Secondary | ICD-10-CM | POA: Insufficient documentation

## 2013-01-31 ENCOUNTER — Telehealth (HOSPITAL_COMMUNITY): Payer: Self-pay | Admitting: *Deleted

## 2013-01-31 ENCOUNTER — Encounter (HOSPITAL_COMMUNITY): Payer: Self-pay | Admitting: *Deleted

## 2013-01-31 NOTE — Telephone Encounter (Signed)
Preadmission screen  

## 2013-02-03 ENCOUNTER — Encounter (HOSPITAL_COMMUNITY): Payer: Self-pay

## 2013-02-03 ENCOUNTER — Encounter (HOSPITAL_COMMUNITY): Payer: Self-pay | Admitting: Anesthesiology

## 2013-02-03 ENCOUNTER — Inpatient Hospital Stay (HOSPITAL_COMMUNITY)
Admission: RE | Admit: 2013-02-03 | Discharge: 2013-02-07 | DRG: 765 | Disposition: A | Discharge status: Home / Self Care | Payer: Medicaid Other | Source: Ambulatory Visit | Attending: Obstetrics | Admitting: Obstetrics

## 2013-02-03 ENCOUNTER — Inpatient Hospital Stay (HOSPITAL_COMMUNITY): Payer: Medicaid Other | Admitting: Anesthesiology

## 2013-02-03 DIAGNOSIS — O34219 Maternal care for unspecified type scar from previous cesarean delivery: Secondary | ICD-10-CM | POA: Diagnosis present

## 2013-02-03 DIAGNOSIS — O98519 Other viral diseases complicating pregnancy, unspecified trimester: Secondary | ICD-10-CM | POA: Diagnosis present

## 2013-02-03 DIAGNOSIS — O9279 Other disorders of lactation: Secondary | ICD-10-CM | POA: Diagnosis not present

## 2013-02-03 DIAGNOSIS — A6000 Herpesviral infection of urogenital system, unspecified: Secondary | ICD-10-CM | POA: Diagnosis present

## 2013-02-03 LAB — CBC
HCT: 32.6 % — ABNORMAL LOW (ref 36.0–46.0)
MCV: 82.5 fL (ref 78.0–100.0)
WBC: 7.8 10*3/uL (ref 4.0–10.5)

## 2013-02-03 LAB — RPR: RPR Ser Ql: NONREACTIVE

## 2013-02-03 MED ORDER — LACTATED RINGERS IV SOLN
INTRAVENOUS | Status: DC
  Administered 2013-02-03 (×3): via INTRAVENOUS

## 2013-02-03 MED ORDER — EPHEDRINE 5 MG/ML INJ: 10.0000 mg | INTRAVENOUS | Status: DC | PRN

## 2013-02-03 MED ORDER — ONDANSETRON HCL 4 MG/2ML IJ SOLN
4.0000 mg | Freq: Four times a day (QID) | INTRAMUSCULAR | Status: DC | PRN
  Administered 2013-02-03 – 2013-02-04 (×2): 4 mg via INTRAVENOUS
  Filled 2013-02-03 (×2): qty 2

## 2013-02-03 MED ORDER — IBUPROFEN 600 MG PO TABS
600.0000 mg | ORAL_TABLET | Freq: Four times a day (QID) | ORAL | Status: DC | PRN
  Administered 2013-02-04 – 2013-02-07 (×11): 600 mg via ORAL
  Filled 2013-02-03 (×12): qty 1

## 2013-02-03 MED ORDER — OXYTOCIN BOLUS FROM INFUSION: 500.0000 mL | INTRAVENOUS | Status: DC

## 2013-02-03 MED ORDER — ACETAMINOPHEN 325 MG PO TABS
650.0000 mg | ORAL_TABLET | ORAL | Status: DC | PRN
  Administered 2013-02-04: 650 mg via ORAL
  Filled 2013-02-03: qty 2

## 2013-02-03 MED ORDER — PHENYLEPHRINE 40 MCG/ML (10ML) SYRINGE FOR IV PUSH (FOR BLOOD PRESSURE SUPPORT): 80.0000 ug | PREFILLED_SYRINGE | INTRAVENOUS | Status: DC | PRN

## 2013-02-03 MED ORDER — OXYTOCIN 40 UNITS IN LACTATED RINGERS INFUSION - SIMPLE MED
1.0000 m[IU]/min | INTRAVENOUS | Status: DC
  Administered 2013-02-03: 8 m[IU]/min via INTRAVENOUS
  Administered 2013-02-03: 2 m[IU]/min via INTRAVENOUS
  Administered 2013-02-03: 12 m[IU]/min via INTRAVENOUS
  Filled 2013-02-03: qty 1000

## 2013-02-03 MED ORDER — FENTANYL 2.5 MCG/ML BUPIVACAINE 1/10 % EPIDURAL INFUSION (WH - ANES)
INTRAMUSCULAR | Status: DC | PRN
  Administered 2013-02-03: 13 mL/h via EPIDURAL

## 2013-02-03 MED ORDER — LACTATED RINGERS IV SOLN
INTRAVENOUS | Status: DC
  Administered 2013-02-03: 21:00:00 via INTRAUTERINE

## 2013-02-03 MED ORDER — LACTATED RINGERS IV SOLN
500.0000 mL | Freq: Once | INTRAVENOUS | Status: AC
  Administered 2013-02-04: 500 mL via INTRAVENOUS

## 2013-02-03 MED ORDER — LIDOCAINE HCL (PF) 1 % IJ SOLN: 30.0000 mL | INTRAMUSCULAR | Status: DC | PRN

## 2013-02-03 MED ORDER — BUTORPHANOL TARTRATE 1 MG/ML IJ SOLN: 1.0000 mg | INTRAMUSCULAR | Status: DC | PRN

## 2013-02-03 MED ORDER — LIDOCAINE HCL (PF) 1 % IJ SOLN
INTRAMUSCULAR | Status: DC | PRN
  Administered 2013-02-03: 4 mL
  Administered 2013-02-03: 3 mL

## 2013-02-03 MED ORDER — TERBUTALINE SULFATE 1 MG/ML IJ SOLN: 0.2500 mg | Freq: Once | INTRAMUSCULAR | Status: AC | PRN

## 2013-02-03 MED ORDER — EPHEDRINE 5 MG/ML INJ
10.0000 mg | INTRAVENOUS | Status: DC | PRN
  Filled 2013-02-03: qty 4

## 2013-02-03 MED ORDER — LACTATED RINGERS IV SOLN: 500.0000 mL | INTRAVENOUS | Status: DC | PRN

## 2013-02-03 MED ORDER — OXYCODONE-ACETAMINOPHEN 5-325 MG PO TABS: 1.0000 | ORAL_TABLET | ORAL | Status: DC | PRN

## 2013-02-03 MED ORDER — CITRIC ACID-SODIUM CITRATE 334-500 MG/5ML PO SOLN
30.0000 mL | ORAL | Status: DC | PRN
  Administered 2013-02-03 – 2013-02-04 (×2): 30 mL via ORAL
  Filled 2013-02-03 (×2): qty 15

## 2013-02-03 MED ORDER — FENTANYL 2.5 MCG/ML BUPIVACAINE 1/10 % EPIDURAL INFUSION (WH - ANES)
14.0000 mL/h | INTRAMUSCULAR | Status: DC | PRN
  Administered 2013-02-03 – 2013-02-04 (×2): 14 mL/h via EPIDURAL
  Filled 2013-02-03 (×3): qty 125

## 2013-02-03 MED ORDER — DIPHENHYDRAMINE HCL 50 MG/ML IJ SOLN: 12.5000 mg | INTRAMUSCULAR | Status: DC | PRN

## 2013-02-03 MED ORDER — FLEET ENEMA 7-19 GM/118ML RE ENEM: 1.0000 | ENEMA | RECTAL | Status: DC | PRN

## 2013-02-03 MED ORDER — OXYTOCIN 40 UNITS IN LACTATED RINGERS INFUSION - SIMPLE MED: 62.5000 mL/h | INTRAVENOUS | Status: DC

## 2013-02-03 MED ORDER — PHENYLEPHRINE 40 MCG/ML (10ML) SYRINGE FOR IV PUSH (FOR BLOOD PRESSURE SUPPORT)
80.0000 ug | PREFILLED_SYRINGE | INTRAVENOUS | Status: DC | PRN
  Filled 2013-02-03: qty 5

## 2013-02-03 NOTE — Anesthesia Preprocedure Evaluation (Addendum)
Anesthesia Evaluation  Patient identified by MRN, date of birth, ID band Patient awake    Reviewed: Allergy & Precautions, H&P , Patient's Chart, lab work & pertinent test results  Airway Mallampati: II TM Distance: >3 FB Neck ROM: full    Dental no notable dental hx. (+) Teeth Intact   Pulmonary Current Smoker,  breath sounds clear to auscultation  Pulmonary exam normal       Cardiovascular negative cardio ROS  Rhythm:regular Rate:Normal     Neuro/Psych negative neurological ROS  negative psych ROS   GI/Hepatic negative GI ROS, (+)       cocaine use,   Endo/Other  BMI 39.2  Renal/GU negative Renal ROS  negative genitourinary   Musculoskeletal   Abdominal Normal abdominal exam  (+)   Peds  Hematology  (+) anemia ,   Anesthesia Other Findings   Reproductive/Obstetrics (+) Pregnancy (failed TOLAC --> for repeat c/s)                          Anesthesia Physical Anesthesia Plan  ASA: II and emergent  Anesthesia Plan: Epidural   Post-op Pain Management:    Induction:   Airway Management Planned:   Additional Equipment:   Intra-op Plan:   Post-operative Plan:   Informed Consent: I have reviewed the patients History and Physical, chart, labs and discussed the procedure including the risks, benefits and alternatives for the proposed anesthesia with the patient or authorized representative who has indicated his/her understanding and acceptance.     Plan Discussed with: Anesthesiologist, Surgeon and CRNA  Anesthesia Plan Comments:        Anesthesia Quick Evaluation

## 2013-02-03 NOTE — Progress Notes (Signed)
Plan to check pt's cervix in 2 hrs, if no change, then plan for c/s

## 2013-02-03 NOTE — H&P (Signed)
This is Dr. Francoise Ceo dictating the history and physical on Anna Calhoun she's a 32 year old gravida 5 para 08/11/2002 at 40 weeks and 2 days [facility] 02/01/2013 negative GBS in for induction at term cervix is 3 cm 80% vertex -2-3 amniotomy performed the fluids clear IUPC inserted and she's on low-dose Pitocin she also had one C-section in the past she has a history of herpes has had no recent outbreaks and is on Valtrex 500 by mouth daily Past medical history history of herpes as noted above Past surgical history C-section in the past Social history negative System review negative Physical exam well-developed female in no distress HEENT negative Lungs clear to P&A Heart regular rhythm no murmurs no gallops Breasts engorged Abdomen term Pelvic as described above Extremities negative

## 2013-02-03 NOTE — Anesthesia Procedure Notes (Signed)
Epidural Patient location during procedure: OB Start time: 02/03/2013 12:41 PM  Staffing Anesthesiologist: Jeniel Slauson A. Performed by: anesthesiologist   Preanesthetic Checklist Completed: patient identified, site marked, surgical consent, pre-op evaluation, timeout performed, IV checked, risks and benefits discussed and monitors and equipment checked  Epidural Patient position: sitting Prep: site prepped and draped and DuraPrep Patient monitoring: continuous pulse ox and blood pressure Approach: midline Injection technique: LOR air  Needle:  Needle type: Tuohy  Needle gauge: 17 G Needle length: 9 cm and 9 Needle insertion depth: 4 cm Catheter type: closed end flexible Catheter size: 19 Gauge Catheter at skin depth: 9 cm Test dose: negative and Other  Assessment Events: blood not aspirated, injection not painful, no injection resistance, negative IV test and no paresthesia  Additional Notes Patient identified. Risks and benefits discussed including failed block, incomplete  Pain control, post dural puncture headache, nerve damage, paralysis, blood pressure Changes, nausea, vomiting, reactions to medications-both toxic and allergic and post Partum back pain. All questions were answered. Patient expressed understanding and wished to proceed. Sterile technique was used throughout procedure. Epidural site was Dressed with sterile barrier dressing. No paresthesias, signs of intravascular injection Or signs of intrathecal spread were encountered.  Patient was more comfortable after the epidural was dosed. Please see RN's note for documentation of vital signs and FHR which are stable.

## 2013-02-03 NOTE — Progress Notes (Signed)
Patient ID: Anna Calhoun, female   DOB: [birth year], 32 y.o.   MRN: [id number] At 5 PM patient was 5 cm 90% vertex -1 6 PM and 8 PM 6 cm and now she uses 6 cm and then the percent vertex -1 adequate labor was reexamined 2 hours

## 2013-02-04 ENCOUNTER — Encounter (HOSPITAL_COMMUNITY): Payer: Self-pay

## 2013-02-04 ENCOUNTER — Encounter (HOSPITAL_COMMUNITY)
Admission: RE | Disposition: A | Discharge status: Home / Self Care | Payer: Self-pay | Source: Ambulatory Visit | Attending: Obstetrics

## 2013-02-04 LAB — CBC
Hemoglobin: 9.5 g/dL — ABNORMAL LOW (ref 12.0–15.0)
MCH: 27.3 pg (ref 26.0–34.0)
MCV: 82.2 fL (ref 78.0–100.0)
Platelets: 170 10*3/uL (ref 150–400)
RBC: 3.48 MIL/uL — ABNORMAL LOW (ref 3.87–5.11)
WBC: 15.7 10*3/uL — ABNORMAL HIGH (ref 4.0–10.5)

## 2013-02-04 LAB — RAPID URINE DRUG SCREEN, HOSP PERFORMED
Cocaine: NOT DETECTED
Opiates: POSITIVE — AB

## 2013-02-04 LAB — PREPARE RBC (CROSSMATCH)

## 2013-02-04 SURGERY — Surgical Case
Anesthesia: Epidural | Site: Abdomen | Wound class: Clean Contaminated

## 2013-02-04 MED ORDER — FENTANYL CITRATE 0.05 MG/ML IJ SOLN
25.0000 ug | INTRAMUSCULAR | Status: DC | PRN
  Administered 2013-02-04 (×2): 50 ug via INTRAVENOUS

## 2013-02-04 MED ORDER — SIMETHICONE 80 MG PO CHEW
80.0000 mg | CHEWABLE_TABLET | Freq: Three times a day (TID) | ORAL | Status: DC
  Administered 2013-02-04 – 2013-02-07 (×10): 80 mg via ORAL

## 2013-02-04 MED ORDER — CEFAZOLIN SODIUM-DEXTROSE 2-3 GM-% IV SOLR
2.0000 g | Freq: Once | INTRAVENOUS | Status: DC
  Filled 2013-02-04: qty 50

## 2013-02-04 MED ORDER — OXYTOCIN 10 UNIT/ML IJ SOLN
INTRAMUSCULAR | Status: AC
  Filled 2013-02-04: qty 4

## 2013-02-04 MED ORDER — ONDANSETRON HCL 4 MG/2ML IJ SOLN: 4.0000 mg | INTRAMUSCULAR | Status: DC | PRN

## 2013-02-04 MED ORDER — PHENYLEPHRINE 40 MCG/ML (10ML) SYRINGE FOR IV PUSH (FOR BLOOD PRESSURE SUPPORT)
PREFILLED_SYRINGE | INTRAVENOUS | Status: AC
  Filled 2013-02-04: qty 10

## 2013-02-04 MED ORDER — PHENYLEPHRINE HCL 10 MG/ML IJ SOLN
INTRAMUSCULAR | Status: DC | PRN
  Administered 2013-02-04: 80 ug via INTRAVENOUS
  Administered 2013-02-04 (×4): 40 ug via INTRAVENOUS

## 2013-02-04 MED ORDER — DIPHENHYDRAMINE HCL 25 MG PO CAPS: 25.0000 mg | ORAL_CAPSULE | Freq: Four times a day (QID) | ORAL | Status: DC | PRN

## 2013-02-04 MED ORDER — ONDANSETRON HCL 4 MG PO TABS: 4.0000 mg | ORAL_TABLET | ORAL | Status: DC | PRN

## 2013-02-04 MED ORDER — ZOLPIDEM TARTRATE 5 MG PO TABS: 5.0000 mg | ORAL_TABLET | Freq: Every evening | ORAL | Status: DC | PRN

## 2013-02-04 MED ORDER — NALBUPHINE SYRINGE 5 MG/0.5 ML
INJECTION | INTRAMUSCULAR | Status: AC
  Administered 2013-02-04: 10 mg via SUBCUTANEOUS
  Filled 2013-02-04: qty 1

## 2013-02-04 MED ORDER — MENTHOL 3 MG MT LOZG: 1.0000 | LOZENGE | OROMUCOSAL | Status: DC | PRN

## 2013-02-04 MED ORDER — DIPHENHYDRAMINE HCL 50 MG/ML IJ SOLN: 12.5000 mg | INTRAMUSCULAR | Status: DC | PRN

## 2013-02-04 MED ORDER — ONDANSETRON HCL 4 MG/2ML IJ SOLN
INTRAMUSCULAR | Status: AC
  Filled 2013-02-04: qty 2

## 2013-02-04 MED ORDER — SIMETHICONE 80 MG PO CHEW: 80.0000 mg | CHEWABLE_TABLET | ORAL | Status: DC | PRN

## 2013-02-04 MED ORDER — AMPICILLIN SODIUM 2 G IJ SOLR
2.0000 g | Freq: Four times a day (QID) | INTRAMUSCULAR | Status: DC
  Administered 2013-02-04: 2 g via INTRAVENOUS
  Filled 2013-02-04 (×3): qty 2000

## 2013-02-04 MED ORDER — NALBUPHINE HCL 10 MG/ML IJ SOLN
5.0000 mg | INTRAMUSCULAR | Status: DC | PRN
  Administered 2013-02-04: 10 mg via INTRAVENOUS
  Filled 2013-02-04 (×2): qty 1

## 2013-02-04 MED ORDER — SCOPOLAMINE 1 MG/3DAYS TD PT72: 1.0000 | MEDICATED_PATCH | Freq: Once | TRANSDERMAL | Status: DC

## 2013-02-04 MED ORDER — METOCLOPRAMIDE HCL 5 MG/ML IJ SOLN: 10.0000 mg | Freq: Three times a day (TID) | INTRAMUSCULAR | Status: DC | PRN

## 2013-02-04 MED ORDER — PRENATAL MULTIVITAMIN CH
1.0000 | ORAL_TABLET | Freq: Every day | ORAL | Status: DC
  Administered 2013-02-04 – 2013-02-05 (×2): 1 via ORAL
  Filled 2013-02-04 (×2): qty 1

## 2013-02-04 MED ORDER — KETOROLAC TROMETHAMINE 60 MG/2ML IM SOLN: 60.0000 mg | Freq: Once | INTRAMUSCULAR | Status: AC | PRN

## 2013-02-04 MED ORDER — MORPHINE SULFATE (PF) 0.5 MG/ML IJ SOLN
INTRAMUSCULAR | Status: DC | PRN
  Administered 2013-02-04: 3 mg via EPIDURAL

## 2013-02-04 MED ORDER — SCOPOLAMINE 1 MG/3DAYS TD PT72
MEDICATED_PATCH | TRANSDERMAL | Status: AC
  Administered 2013-02-04: 1.5 mg
  Filled 2013-02-04: qty 1

## 2013-02-04 MED ORDER — MEPERIDINE HCL 25 MG/ML IJ SOLN: 6.2500 mg | INTRAMUSCULAR | Status: DC | PRN

## 2013-02-04 MED ORDER — SENNOSIDES-DOCUSATE SODIUM 8.6-50 MG PO TABS
2.0000 | ORAL_TABLET | Freq: Every day | ORAL | Status: DC
  Administered 2013-02-04 – 2013-02-06 (×3): 2 via ORAL

## 2013-02-04 MED ORDER — LANOLIN HYDROUS EX OINT: 1.0000 "application " | TOPICAL_OINTMENT | CUTANEOUS | Status: DC | PRN

## 2013-02-04 MED ORDER — OXYCODONE-ACETAMINOPHEN 5-325 MG PO TABS
1.0000 | ORAL_TABLET | ORAL | Status: DC | PRN
  Administered 2013-02-05 (×2): 1 via ORAL
  Administered 2013-02-05: 2 via ORAL
  Administered 2013-02-05 – 2013-02-06 (×3): 1 via ORAL
  Administered 2013-02-06: 2 via ORAL
  Administered 2013-02-06 – 2013-02-07 (×2): 1 via ORAL
  Filled 2013-02-04: qty 2
  Filled 2013-02-04: qty 1
  Filled 2013-02-04: qty 2
  Filled 2013-02-04 (×6): qty 1

## 2013-02-04 MED ORDER — OXYTOCIN 40 UNITS IN LACTATED RINGERS INFUSION - SIMPLE MED: 62.5000 mL/h | INTRAVENOUS | Status: AC

## 2013-02-04 MED ORDER — TETANUS-DIPHTH-ACELL PERTUSSIS 5-2.5-18.5 LF-MCG/0.5 IM SUSP: 0.5000 mL | Freq: Once | INTRAMUSCULAR | Status: DC

## 2013-02-04 MED ORDER — KETOROLAC TROMETHAMINE 60 MG/2ML IM SOLN
INTRAMUSCULAR | Status: AC
  Administered 2013-02-04: 60 mg via INTRAMUSCULAR
  Filled 2013-02-04: qty 2

## 2013-02-04 MED ORDER — KETOROLAC TROMETHAMINE 30 MG/ML IJ SOLN: 30.0000 mg | Freq: Four times a day (QID) | INTRAMUSCULAR | Status: AC | PRN

## 2013-02-04 MED ORDER — LACTATED RINGERS IV SOLN
INTRAVENOUS | Status: DC | PRN
  Administered 2013-02-04 (×5): via INTRAVENOUS

## 2013-02-04 MED ORDER — ONDANSETRON HCL 4 MG/2ML IJ SOLN: 4.0000 mg | Freq: Three times a day (TID) | INTRAMUSCULAR | Status: DC | PRN

## 2013-02-04 MED ORDER — PHENYLEPHRINE 40 MCG/ML (10ML) SYRINGE FOR IV PUSH (FOR BLOOD PRESSURE SUPPORT)
PREFILLED_SYRINGE | INTRAVENOUS | Status: AC
  Filled 2013-02-04: qty 5

## 2013-02-04 MED ORDER — CEFAZOLIN SODIUM-DEXTROSE 2-3 GM-% IV SOLR
INTRAVENOUS | Status: DC | PRN
  Administered 2013-02-04: 2 g via INTRAVENOUS

## 2013-02-04 MED ORDER — ONDANSETRON HCL 4 MG/2ML IJ SOLN
INTRAMUSCULAR | Status: DC | PRN
  Administered 2013-02-04: 4 mg via INTRAVENOUS

## 2013-02-04 MED ORDER — WITCH HAZEL-GLYCERIN EX PADS
1.0000 "application " | MEDICATED_PAD | CUTANEOUS | Status: DC | PRN
  Administered 2013-02-05: 1 via TOPICAL

## 2013-02-04 MED ORDER — DIPHENHYDRAMINE HCL 50 MG/ML IJ SOLN: 25.0000 mg | INTRAMUSCULAR | Status: DC | PRN

## 2013-02-04 MED ORDER — FENTANYL CITRATE 0.05 MG/ML IJ SOLN
INTRAMUSCULAR | Status: AC
  Administered 2013-02-04: 50 ug via INTRAVENOUS
  Filled 2013-02-04: qty 2

## 2013-02-04 MED ORDER — IBUPROFEN 600 MG PO TABS: 600.0000 mg | ORAL_TABLET | Freq: Four times a day (QID) | ORAL | Status: DC

## 2013-02-04 MED ORDER — NALOXONE HCL 0.4 MG/ML IJ SOLN: 0.4000 mg | INTRAMUSCULAR | Status: DC | PRN

## 2013-02-04 MED ORDER — NALBUPHINE SYRINGE 5 MG/0.5 ML
5.0000 mg | INJECTION | INTRAMUSCULAR | Status: DC | PRN
Start: 2013-02-04 — End: 2013-02-07
  Administered 2013-02-04: 10 mg via SUBCUTANEOUS
  Filled 2013-02-04: qty 1

## 2013-02-04 MED ORDER — OXYTOCIN 10 UNIT/ML IJ SOLN
40.0000 [IU] | INTRAVENOUS | Status: DC | PRN
  Administered 2013-02-04: 40 [IU] via INTRAVENOUS

## 2013-02-04 MED ORDER — LACTATED RINGERS IV SOLN
INTRAVENOUS | Status: DC | PRN
  Administered 2013-02-04 (×2): via INTRAVENOUS

## 2013-02-04 MED ORDER — SODIUM CHLORIDE 0.9 % IJ SOLN: 3.0000 mL | INTRAMUSCULAR | Status: DC | PRN

## 2013-02-04 MED ORDER — SODIUM BICARBONATE 8.4 % IV SOLN
INTRAVENOUS | Status: DC | PRN
  Administered 2013-02-04 (×2): 5 mL via EPIDURAL

## 2013-02-04 MED ORDER — DIPHENHYDRAMINE HCL 25 MG PO CAPS: 25.0000 mg | ORAL_CAPSULE | ORAL | Status: DC | PRN

## 2013-02-04 MED ORDER — LACTATED RINGERS IV SOLN
INTRAVENOUS | Status: DC
  Administered 2013-02-04: 07:00:00 via INTRAVENOUS

## 2013-02-04 MED ORDER — DEXTROSE 5 % IV SOLN
1.0000 ug/kg/h | INTRAVENOUS | Status: DC | PRN
  Filled 2013-02-04: qty 2

## 2013-02-04 MED ORDER — DIBUCAINE 1 % RE OINT
1.0000 "application " | TOPICAL_OINTMENT | RECTAL | Status: DC | PRN
  Administered 2013-02-05: 1 via RECTAL
  Filled 2013-02-04: qty 28

## 2013-02-04 MED ORDER — MORPHINE SULFATE 0.5 MG/ML IJ SOLN
INTRAMUSCULAR | Status: AC
  Filled 2013-02-04: qty 10

## 2013-02-04 SURGICAL SUPPLY — 32 items
CLAMP CORD UMBIL (MISCELLANEOUS) IMPLANT
CLOTH BEACON ORANGE TIMEOUT ST (SAFETY) ×2 IMPLANT
DERMABOND ADVANCED (GAUZE/BANDAGES/DRESSINGS) ×1
DERMABOND ADVANCED .7 DNX12 (GAUZE/BANDAGES/DRESSINGS) ×1 IMPLANT
DRAPE LG THREE QUARTER DISP (DRAPES) ×2 IMPLANT
DRSG OPSITE POSTOP 4X10 (GAUZE/BANDAGES/DRESSINGS) ×2 IMPLANT
DURAPREP 26ML APPLICATOR (WOUND CARE) ×2 IMPLANT
ELECT REM PT RETURN 9FT ADLT (ELECTROSURGICAL) ×2
ELECTRODE REM PT RTRN 9FT ADLT (ELECTROSURGICAL) ×1 IMPLANT
EXTRACTOR VACUUM M CUP 4 TUBE (SUCTIONS) IMPLANT
GLOVE BIO SURGEON STRL SZ8.5 (GLOVE) ×2 IMPLANT
GOWN PREVENTION PLUS XXLARGE (GOWN DISPOSABLE) ×2 IMPLANT
GOWN STRL REIN XL XLG (GOWN DISPOSABLE) ×4 IMPLANT
KIT ABG SYR 3ML LUER SLIP (SYRINGE) IMPLANT
NEEDLE HYPO 25X5/8 SAFETYGLIDE (NEEDLE) ×2 IMPLANT
NS IRRIG 1000ML POUR BTL (IV SOLUTION) ×2 IMPLANT
PACK C SECTION WH (CUSTOM PROCEDURE TRAY) ×2 IMPLANT
PAD OB MATERNITY 4.3X12.25 (PERSONAL CARE ITEMS) ×2 IMPLANT
RETRACTOR WND ALEXIS 25 LRG (MISCELLANEOUS) ×1 IMPLANT
RTRCTR WOUND ALEXIS 25CM LRG (MISCELLANEOUS) ×2
SUT CHROMIC 0 CT 802H (SUTURE) ×2 IMPLANT
SUT CHROMIC 1 CTX 36 (SUTURE) ×4 IMPLANT
SUT CHROMIC 2 0 SH (SUTURE) ×2 IMPLANT
SUT GUT PLAIN 0 CT-3 TAN 27 (SUTURE) IMPLANT
SUT MON AB 4-0 PS1 27 (SUTURE) ×2 IMPLANT
SUT VIC AB 0 CT1 18XCR BRD8 (SUTURE) IMPLANT
SUT VIC AB 0 CT1 8-18 (SUTURE)
SUT VIC AB 0 CTX 36 (SUTURE) ×2
SUT VIC AB 0 CTX36XBRD ANBCTRL (SUTURE) ×2 IMPLANT
TOWEL OR 17X24 6PK STRL BLUE (TOWEL DISPOSABLE) ×2 IMPLANT
TRAY FOLEY CATH 14FR (SET/KITS/TRAYS/PACK) ×2 IMPLANT
WATER STERILE IRR 1000ML POUR (IV SOLUTION) IMPLANT

## 2013-02-04 NOTE — Transfer of Care (Signed)
Immediate Anesthesia Transfer of Care Note  Patient: Anna Calhoun  Procedure(s) Performed: Procedure(s): CESAREAN SECTION (N/A)  Patient Location: PACU  Anesthesia Type:Epidural  Level of Consciousness: awake, alert , oriented and patient cooperative  Airway & Oxygen Therapy: Patient Spontanous Breathing  Post-op Assessment: Report given to PACU RN and Post -op Vital signs reviewed and stable  Post vital signs: Reviewed and stable  Complications: No apparent anesthesia complications

## 2013-02-04 NOTE — Progress Notes (Signed)
Patient ID: Anna Calhoun, female   DOB: [birth year], 32 y.o.   MRN: [id number] Postop day 0 Vital signs normal Incision clean and dry Lochia moderate Doing well and her baby weighed 7 lbs. 10 oz. and

## 2013-02-04 NOTE — OR Nursing (Signed)
Placenta to Utility Refrigerator 

## 2013-02-04 NOTE — Op Note (Signed)
preop diagnosis failed induction failure to progress in labor Postop diagnosis is same Anesthesia epidural Surgeon Dr. Francoise Ceo Procedure patient placed on the operating table in the supine position abdomen prepped and draped bladder emptied with a Foley catheter a transverse suprapubic incision made through the old scar carried down to the rectus fascia fascia cleaned and incised the length of the incision recti muscles retracted laterally peritoneum incised longitudinally a transverse incision made on the visceroperitoneum above the bladder and the bladder mobilized inferiorly a transverse low uterine incision made the fluid was meconium-stained the delivered a female from the OP position Apgar 7 and 9 the placenta was anterior removed manually uterine cavity clean with dry laps uterine incision closed in 2 layers with continuous looped abnormal one chromic bladder flap reattached to a chromic uterus well contracted tubes and ovaries normal abdomen closed in layers peritoneum continuous with 2-0 chromic fascia continuous suture of 0 Dexon and the skin shows a subcuticular stitch of 4-0 Monocryl blood loss 800 cc thank you and

## 2013-02-04 NOTE — Progress Notes (Signed)
UR chart review completed.  

## 2013-02-04 NOTE — Anesthesia Postprocedure Evaluation (Signed)
Anesthesia Post Note  Patient: Anna Calhoun  Procedure(s) Performed: Procedure(s) (LRB): CESAREAN SECTION (N/A)  Anesthesia type: Epidural  Patient location: Mother/Baby  Post pain: Pain level controlled  Post assessment: Post-op Vital signs reviewed  Last Vitals:  Filed Vitals:   02/04/13 1625  BP: 106/54  Pulse: 86  Temp:   Resp: 19    Post vital signs: Reviewed  Level of consciousness:alert  Complications: No apparent anesthesia complications

## 2013-02-04 NOTE — Progress Notes (Signed)
Patient ID: Anna Calhoun, female   DOB: 1980/07/12, 32 y.o.   MRN: [id number] Temp 202 and a bolus of fluid Tylenol and ampicillin 2 g IV fetal heart 18 she her cervix is unchanged after adequate labor she delivered by repeat C-section for failed induction and failure to progress in labor and

## 2013-02-04 NOTE — Anesthesia Postprocedure Evaluation (Signed)
  Anesthesia Post-op Note  Anesthesia Post Note  Patient: Anna Calhoun  Procedure(s) Performed: Procedure(s) (LRB): CESAREAN SECTION (N/A)  Anesthesia type: Epidural  Patient location: PACU  Post pain: Pain level controlled  Post assessment: Post-op Vital signs reviewed  Post vital signs: stable  Level of consciousness: awake  Complications: No apparent anesthesia complications

## 2013-02-04 NOTE — Consult Note (Signed)
Neonatology Note:  Attendance at C-section:  I was asked by Dr. Marshall to attend this repeat C/S at term after attempted VBAC. The mother is a G5P4 A pos, GBS neg with a history of HSV, on Valtrex, without recent lesions. ROM 15 hours prior to delivery, fluid with moderate meconium, amnioinfused. Mother had a temp of 102 during labor and got 1 dose of Ampicillin 1.5 hours before delivery. Infant with good spontaneous cry and decreased tone. Needed bulb suctioning for some thick, green mucous. Ap 7/9. Lungs clear to ausc in DR, no distress. Tone returned to normal by 3 minutes of life. To CN to care of Pediatrician.  Masato Pettie C. Draeden Kellman, MD  

## 2013-02-05 ENCOUNTER — Encounter (HOSPITAL_COMMUNITY): Payer: Self-pay | Admitting: Obstetrics

## 2013-02-05 MED ORDER — PNEUMOCOCCAL VAC POLYVALENT 25 MCG/0.5ML IJ INJ
0.5000 mL | INJECTION | INTRAMUSCULAR | Status: AC
  Filled 2013-02-05: qty 0.5

## 2013-02-05 NOTE — Progress Notes (Signed)
Patient ID: Anna Calhoun, female   DOB: 1981/01/27, 32 y.o.   MRN: [id number] Postop day 1 Vital signs normal Incision clean and dry Legs negative Doing well

## 2013-02-06 NOTE — Progress Notes (Signed)
Patient ID: Anna Calhoun, female   DOB: [birth year], 32 y.o.   MRN: [id number] Postop day 2 Vital signs normal Dressing clean and dry Abdomen soft legs negative patient wants early discharge home today on Percocet for pain
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
## 2013-02-06 NOTE — Discharge Summary (Signed)
Obstetric Discharge Summary Reason for Admission: induction of labor Prenatal Procedures: none Intrapartum Procedures: cesarean: low cervical, transverse Postpartum Procedures: none Complications-Operative and Postpartum: none Hemoglobin  Date Value Range Status  02/04/2013 9.5* 12.0 - 15.0 g/dL Final     HCT  Date Value Range Status  02/04/2013 28.6* 36.0 - 46.0 % Final    Physical Exam:  General: alert Lochia: appropriate Uterine Fundus: firm Incision: healing well DVT Evaluation: No evidence of DVT seen on physical exam.  Discharge Diagnoses: Term Pregnancy-delivered  Discharge Information: Date: 02/06/2013 Activity: pelvic rest Diet: routine Medications: Percocet Condition: improved Instructions: refer to practice specific booklet Discharge to: home Follow-up Information   Follow up with MARSHALL,BERNARD A, MD. Schedule an appointment as soon as possible for a visit in 6 weeks.   Specialty:  Obstetrics and Gynecology   Contact information:   [address] [place] [address] [phone]       Newborn Data: Live born female  Birth Weight: 7 lb 12.5 oz (3530 g) APGAR: 7, 9  Home with mother.  MARSHALL,BERNARD A 02/06/2013, 6:52 AM

## 2013-02-07 LAB — TYPE AND SCREEN
Antibody Screen: NEGATIVE
Unit division: 0

## 2014-04-13 ENCOUNTER — Encounter (HOSPITAL_COMMUNITY): Payer: Self-pay | Admitting: Obstetrics

## 2014-10-28 ENCOUNTER — Encounter (HOSPITAL_COMMUNITY): Payer: Self-pay | Admitting: *Deleted

## 2014-10-28 ENCOUNTER — Inpatient Hospital Stay (HOSPITAL_COMMUNITY)
Admission: AD | Admit: 2014-10-28 | Discharge: 2014-10-28 | Disposition: A | Discharge status: Home / Self Care | Payer: Medicaid Other | Source: Ambulatory Visit | Attending: Obstetrics | Admitting: Obstetrics

## 2014-10-28 ENCOUNTER — Inpatient Hospital Stay (HOSPITAL_COMMUNITY): Payer: Medicaid Other

## 2014-10-28 DIAGNOSIS — Z3A01 Less than 8 weeks gestation of pregnancy: Secondary | ICD-10-CM | POA: Insufficient documentation

## 2014-10-28 DIAGNOSIS — Z87891 Personal history of nicotine dependence: Secondary | ICD-10-CM | POA: Diagnosis not present

## 2014-10-28 DIAGNOSIS — O208 Other hemorrhage in early pregnancy: Secondary | ICD-10-CM | POA: Diagnosis not present

## 2014-10-28 DIAGNOSIS — R1030 Lower abdominal pain, unspecified: Secondary | ICD-10-CM | POA: Insufficient documentation

## 2014-10-28 DIAGNOSIS — O99321 Drug use complicating pregnancy, first trimester: Secondary | ICD-10-CM | POA: Diagnosis not present

## 2014-10-28 DIAGNOSIS — O43891 Other placental disorders, first trimester: Secondary | ICD-10-CM

## 2014-10-28 DIAGNOSIS — F1010 Alcohol abuse, uncomplicated: Secondary | ICD-10-CM

## 2014-10-28 DIAGNOSIS — M549 Dorsalgia, unspecified: Secondary | ICD-10-CM | POA: Diagnosis present

## 2014-10-28 DIAGNOSIS — F1410 Cocaine abuse, uncomplicated: Secondary | ICD-10-CM | POA: Diagnosis not present

## 2014-10-28 DIAGNOSIS — F1490 Cocaine use, unspecified, uncomplicated: Secondary | ICD-10-CM | POA: Insufficient documentation

## 2014-10-28 DIAGNOSIS — O99311 Alcohol use complicating pregnancy, first trimester: Secondary | ICD-10-CM

## 2014-10-28 DIAGNOSIS — O209 Hemorrhage in early pregnancy, unspecified: Secondary | ICD-10-CM

## 2014-10-28 LAB — CBC WITH DIFFERENTIAL/PLATELET
BASOS PCT: 0 % (ref 0–1)
Basophils Absolute: 0 10*3/uL (ref 0.0–0.1)
Eosinophils Absolute: 0.1 10*3/uL (ref 0.0–0.7)
Eosinophils Relative: 1 % (ref 0–5)
HCT: 32.7 % — ABNORMAL LOW (ref 36.0–46.0)
Hemoglobin: 10.8 g/dL — ABNORMAL LOW (ref 12.0–15.0)
Lymphocytes Relative: 37 % (ref 12–46)
Lymphs Abs: 2.7 10*3/uL (ref 0.7–4.0)
MCH: 28.3 pg (ref 26.0–34.0)
MCHC: 33 g/dL (ref 30.0–36.0)
MCV: 85.6 fL (ref 78.0–100.0)
Monocytes Absolute: 0.3 10*3/uL (ref 0.1–1.0)
Monocytes Relative: 5 % (ref 3–12)
NEUTROS PCT: 57 % (ref 43–77)
Neutro Abs: 4.1 10*3/uL (ref 1.7–7.7)
PLATELETS: 253 10*3/uL (ref 150–400)
RBC: 3.82 MIL/uL — AB (ref 3.87–5.11)
RDW: 13.9 % (ref 11.5–15.5)
WBC: 7.2 10*3/uL (ref 4.0–10.5)

## 2014-10-28 LAB — POCT PREGNANCY, URINE: PREG TEST UR: POSITIVE — AB

## 2014-10-28 LAB — RAPID URINE DRUG SCREEN, HOSP PERFORMED
Amphetamines: NOT DETECTED
Barbiturates: NOT DETECTED
Benzodiazepines: NOT DETECTED
Cocaine: POSITIVE — AB
Opiates: NOT DETECTED
Tetrahydrocannabinol: NOT DETECTED

## 2014-10-28 LAB — URINALYSIS, ROUTINE W REFLEX MICROSCOPIC
BILIRUBIN URINE: NEGATIVE
Glucose, UA: NEGATIVE mg/dL
KETONES UR: NEGATIVE mg/dL
Leukocytes, UA: NEGATIVE
Nitrite: NEGATIVE
PH: 6 (ref 5.0–8.0)
Protein, ur: NEGATIVE mg/dL
Specific Gravity, Urine: 1.025 (ref 1.005–1.030)
Urobilinogen, UA: 0.2 mg/dL (ref 0.0–1.0)

## 2014-10-28 LAB — URINE MICROSCOPIC-ADD ON

## 2014-10-28 LAB — WET PREP, GENITAL
CLUE CELLS WET PREP: NONE SEEN
TRICH WET PREP: NONE SEEN
WBC WET PREP: NONE SEEN
YEAST WET PREP: NONE SEEN

## 2014-10-28 LAB — HCG, QUANTITATIVE, PREGNANCY: HCG, BETA CHAIN, QUANT, S: 52874 m[IU]/mL — AB (ref <5)

## 2014-10-28 NOTE — Discharge Instructions (Signed)
Vaginal Bleeding During Pregnancy, First Trimester A small amount of bleeding (spotting) from the vagina is relatively common in early pregnancy. It usually stops on its own. Various things may cause bleeding or spotting in early pregnancy. Some bleeding may be related to the pregnancy, and some may not. In most cases, the bleeding is normal and is not a problem. However, bleeding can also be a sign of something serious. Be sure to tell your health care provider about any vaginal bleeding right away. Some possible causes of vaginal bleeding during the first trimester include:  Infection or inflammation of the cervix.  Growths (polyps) on the cervix.  Miscarriage or threatened miscarriage.  Pregnancy tissue has developed outside of the uterus and in a fallopian tube (tubal pregnancy).  Tiny cysts have developed in the uterus instead of pregnancy tissue (molar pregnancy). HOME CARE INSTRUCTIONS  Watch your condition for any changes. The following actions may help to lessen any discomfort you are feeling:  Follow your health care provider's instructions for limiting your activity. If your health care provider orders bed rest, you may need to stay in bed and only get up to use the bathroom. However, your health care provider may allow you to continue light activity.  If needed, make plans for someone to help with your regular activities and responsibilities while you are on bed rest.  Keep track of the number of pads you use each day, how often you change pads, and how soaked (saturated) they are. Write this down.  Do not use tampons. Do not douche.  Do not have sexual intercourse or orgasms until approved by your health care provider.  If you pass any tissue from your vagina, save the tissue so you can show it to your health care provider.  Only take over-the-counter or prescription medicines as directed by your health care provider.  Do not take aspirin because it can make you  bleed.  Keep all follow-up appointments as directed by your health care provider. SEEK MEDICAL CARE IF:  You have any vaginal bleeding during any part of your pregnancy.  You have cramps or labor pains.  You have a fever, not controlled by medicine. SEEK IMMEDIATE MEDICAL CARE IF:   You have severe cramps in your back or belly (abdomen).  You pass large clots or tissue from your vagina.  Your bleeding increases.  You feel light-headed or weak, or you have fainting episodes.  You have chills.  You are leaking fluid or have a gush of fluid from your vagina.  You pass out while having a bowel movement. MAKE SURE YOU:  Understand these instructions.  Will watch your condition.  Will get help right away if you are not doing well or get worse. Document Released: 03/08/2005 Document Revised: 06/03/2013 Document Reviewed: 02/03/2013 [facility]ExitCare Patient Information 2015 [place], [place]. This information is not intended to replace advice given to you by your health care provider. Make sure you discuss any questions you have with your health care provider.  Vaginal Bleeding During Pregnancy, First Trimester A small amount of bleeding (spotting) from the vagina is common in early pregnancy. Sometimes the bleeding is normal and is not a problem, and sometimes it is a sign of something serious. Be sure to tell your doctor about any bleeding from your vagina right away. HOME CARE  Watch your condition for any changes.  Follow your doctor's instructions about how active you can be.  If you are on bed rest:  You may need to  stay in bed and only get up to use the bathroom.  You may be allowed to do some activities.  If you need help, make plans for someone to help you.  Write down:  The number of pads you use each day.  How often you change pads.  How soaked (saturated) your pads are.  Do not use tampons.  Do not douche.  Do not have sex or orgasms until your doctor says it  is okay.  If you pass any tissue from your vagina, save the tissue so you can show it to your doctor.  Only take medicines as told by your doctor.  Do not take aspirin because it can make you bleed.  Keep all follow-up visits as told by your doctor. GET HELP IF:   You bleed from your vagina.  You have cramps.  You have labor pains.  You have a fever that does not go away after you take medicine. GET HELP RIGHT AWAY IF:   You have very bad cramps in your back or belly (abdomen).  You pass large clots or tissue from your vagina.  You bleed more.  You feel light-headed or weak.  You pass out (faint).  You have chills.  You are leaking fluid or have a gush of fluid from your vagina.  You pass out while pooping (having a bowel movement). MAKE SURE YOU:  Understand these instructions.  Will watch your condition.  Will get help right away if you are not doing well or get worse. Document Released: 10/13/2013 Document Reviewed: 02/03/2013 [facility] Patient Information 2015 [place]Care, [place]LLC. This information is not intended to replace advice given to you by your health care provider. Make sure you discuss any questions you have with your health care provider.  Pelvic Rest Pelvic rest is sometimes recommended for women when:   The placenta is partially or completely covering the opening of the cervix (placenta previa).  There is bleeding between the uterine wall and the amniotic sac in the first trimester (subchorionic hemorrhage).  The cervix begins to open without labor starting (incompetent cervix, cervical insufficiency).  The labor is too early (preterm labor). HOME CARE INSTRUCTIONS  Do not have sexual intercourse, stimulation, or an orgasm.  Do not use tampons, douche, or put anything in the vagina.  Do not lift anything over 10 pounds (4.5 kg).  Avoid strenuous activity or straining your pelvic muscles. SEEK MEDICAL CARE IF:  You have any vaginal bleeding  during pregnancy. Treat this as a potential emergency.  You have cramping pain felt low in the stomach (stronger than menstrual cramps).  You notice vaginal discharge (watery, mucus, or bloody).  You have a low, dull backache.  There are regular contractions or uterine tightening. SEEK IMMEDIATE MEDICAL CARE IF: You have vaginal bleeding and have placenta previa.  Document Released: 09/23/2010 Document Revised: 08/21/2011 Document Reviewed: 09/23/2010 [facility] Patient Information 2015 [place]Exit[place], [place]LL[place]. This information is not intended to replace advice given to you by your health care provider. Make sure you discuss any questions you have with your health care provider.

## 2014-10-28 NOTE — MAU Provider Note (Signed)
History     CSN: [id number]  Arrival date and time: 10/28/14 1600   First Provider Initiated Contact with Patient 10/28/14 1818      Chief Complaint  Patient presents with  . Possible Pregnancy  . Abdominal Pain   HPI  Saroya Riccobono Amparan 34 y.o. [id number]  presents to MAU with complaint of back pain and lower abdominal pain that started 4 days ago.  The stomach pain is very sharp, comes and goes lasts only a second or two.  It occurs 20-30 times per day.  No aggravating or alleviating factors.  Not noticed during sleep.  Laying down is probably better.  Denies nausea or vomiting, vaginal bleeding or discharge.  Although when she urinated here today, she saw a tiny pinkish smudge on the toilet tissue.  She denies fever, weakness, syncope, dysuria.   No h/o HTN.  No medication for this.  She donates plasma but has not since April.  She notes cocaine and alcohol use this past weekend. OB History    Gravida Para Term Preterm AB TAB SAB Ectopic Multiple Living   0     5      Past Medical History  Diagnosis Date  . Gonorrhea 1997  . Chlamydia 1997  . Medical history non-contributory   . Herpes     Past Surgical History  Procedure Laterality Date  . Cesarean section    . No past surgeries    . Cesarean section N/A 02/04/2013    Procedure: CESAREAN SECTION;  Surgeon: Kathreen Cosier, MD;  Location: [facility];  Service: Obstetrics;  Laterality: N/A;    Family History  Problem Relation Age of Onset  . Hypertension Mother   . Diabetes Father   . Cancer Maternal Grandfather   . Cancer Paternal Grandmother     History  Substance Use Topics  . Smoking status: Former Games developer  . Smokeless tobacco: Never Used  . Alcohol Use: Yes     Comment: varies not since pregnancy    Allergies: No Known Allergies  No prescriptions prior to admission    ROS Pertinent ROS in HPI.  All other systems are negative.   Physical Exam   Blood pressure 147/70, pulse 67, temperature  97.9 F (36.6 C), temperature source Oral, resp. rate 18, last menstrual period 09/13/2014, unknown if currently breastfeeding.  Physical Exam  Constitutional: She is oriented to person, place, and time. She appears well-developed and well-nourished. No distress.  HENT:  Head: Normocephalic and atraumatic.  Eyes: EOM are normal.  Neck: Normal range of motion.  Cardiovascular: Normal rate, regular rhythm and normal heart sounds.   Respiratory: Effort normal and breath sounds normal. No respiratory distress.  GI: Soft. Bowel sounds are normal. She exhibits no distension.  LLQ tenderness  Musculoskeletal: Normal range of motion.  Neurological: She is alert and oriented to person, place, and time.  Skin: Skin is warm and dry.  Psychiatric: She has a normal mood and affect.   US Ob Comp Less 14 Wks  10/28/2014   CLINICAL DATA:  First trimester pregnancy, vaginal bleeding/spotting for a few days, abdominal pain and cramping at times ; no quantitative beta HCG for correlation  EXAM: OBSTETRIC <14 WK [place] AND TRANSVAGINAL OB US  TECHNIQUE: Both transabdominal and transvaginal ultrasound examinations were performed for complete evaluation of the gestation as well as the maternal uterus, adnexal regions, and pelvic cul-de-sac. Transvaginal technique was performed to assess early pregnancy.  COMPARISON:  None for  this gestation  FINDINGS: Intrauterine gestational sac: Visualized/normal in shape.  Yolk sac:  Present  Embryo:  Present  Cardiac Activity: Present  Heart Rate: 110  bpm  CRL:  7.5  mm   6 w   5 d                  US EDC: 06/17/2014  Maternal uterus/adnexae:  Small subchronic hemorrhage.  LEFT ovary normal size and morphology 1.5 x 1.2 x 2.4 cm.  RIGHT ovary measures 6.3 x 5.3 x 6.4 cm and contains a simple appearing cyst 5.0 x 5.0 x 4.8 cm.  Trace free pelvic fluid.  No adnexal masses.  IMPRESSION: Single live early intrauterine gestational measured at 6 weeks 5 days EGA by crown-rump length.  Small  subchorionic hemorrhage.  Simple appearing RIGHT ovarian cyst 5.0 cm greatest diameter.   Electronically Signed   By: Ulyses SouthwardMark  Boles M.D.   On: 10/28/2014 19:48   [place] Ob Transvaginal  10/28/2014   CLINICAL DATA:  First trimester pregnancy, vaginal bleeding/spotting for a few days, abdominal pain and cramping at times ; no quantitative beta HCG for correlation  EXAM: OBSTETRIC <14 WK US AND TRANSVAGINAL OB US  TECHNIQUE: Both transabdominal and transvaginal ultrasound examinations were performed for complete evaluation of the gestation as well as the maternal uterus, adnexal regions, and pelvic cul-de-sac. Transvaginal technique was performed to assess early pregnancy.  COMPARISON:  None for this gestation  FINDINGS: Intrauterine gestational sac: Visualized/normal in shape.  Yolk sac:  Present  Embryo:  Present  Cardiac Activity: Present  Heart Rate: 110  bpm  CRL:  7.5  mm   6 w   5 d                  US EDC: 06/17/2014  Maternal uterus/adnexae:  Small subchronic hemorrhage.  LEFT ovary normal size and morphology 1.5 x 1.2 x 2.4 cm.  RIGHT ovary measures 6.3 x 5.3 x 6.4 cm and contains a simple appearing cyst 5.0 x 5.0 x 4.8 cm.  Trace free pelvic fluid.  No adnexal masses.  IMPRESSION: Single live early intrauterine gestational measured at 6 weeks 5 days EGA by crown-rump length.  Small subchorionic hemorrhage.  Simple appearing RIGHT ovarian cyst 5.0 cm greatest diameter.   Electronically Signed   By: Ulyses SouthwardMark  Boles M.D.   On: 10/28/2014 19:48   Results for orders placed or performed during the hospital encounter of 10/28/14 (from the past 24 hour(s))  Urine rapid drug screen (hosp performed)     Status: Abnormal   Collection Time: 10/28/14  4:30 PM  Result Value Ref Range   Opiates NONE DETECTED NONE DETECTED   Cocaine POSITIVE (A) NONE DETECTED   Benzodiazepines NONE DETECTED NONE DETECTED   Amphetamines NONE DETECTED NONE DETECTED   Tetrahydrocannabinol NONE DETECTED NONE DETECTED   Barbiturates NONE  DETECTED NONE DETECTED  Urinalysis, Routine w reflex microscopic     Status: Abnormal   Collection Time: 10/28/14  4:34 PM  Result Value Ref Range   Color, Urine YELLOW YELLOW   APPearance CLEAR CLEAR   Specific Gravity, Urine 1.025 1.005 - 1.030   pH 6.0 5.0 - 8.0   Glucose, UA NEGATIVE NEGATIVE mg/dL   Hgb urine dipstick MODERATE (A) NEGATIVE   Bilirubin Urine NEGATIVE NEGATIVE   Ketones, ur NEGATIVE NEGATIVE mg/dL   Protein, ur NEGATIVE NEGATIVE mg/dL   Urobilinogen, UA 0.2 0.0 - 1.0 mg/dL   Nitrite NEGATIVE NEGATIVE   Leukocytes, UA  NEGATIVE NEGATIVE  Urine microscopic-add on     Status: Abnormal   Collection Time: 10/28/14  4:34 PM  Result Value Ref Range   Squamous Epithelial / LPF FEW (A) RARE   WBC, UA 3-6 <3 WBC/hpf   RBC / HPF 0-2 <3 RBC/hpf   Bacteria, UA RARE RARE  Pregnancy, urine POC     Status: Abnormal   Collection Time: 10/28/14  4:41 PM  Result Value Ref Range   Preg Test, Ur POSITIVE (A) NEGATIVE  CBC with Differential/Platelet     Status: Abnormal   Collection Time: 10/28/14  7:02 PM  Result Value Ref Range   WBC 7.2 4.0 - 10.5 K/uL   RBC 3.82 (L) 3.87 - 5.11 MIL/uL   Hemoglobin 10.8 (L) 12.0 - 15.0 g/dL   HCT 40.932.7 (L) 81.136.0 - 91.446.0 %   MCV 85.6 78.0 - 100.0 fL   MCH 28.3 26.0 - 34.0 pg   MCHC 33.0 30.0 - 36.0 g/dL   RDW 78.213.9 95.611.5 - 21.315.5 %   Platelets 253 150 - 400 K/uL   Neutrophils Relative % 57 43 - 77 %   Neutro Abs 4.1 1.7 - 7.7 K/uL   Lymphocytes Relative 37 12 - 46 %   Lymphs Abs 2.7 0.7 - 4.0 K/uL   Monocytes Relative 5 3 - 12 %   Monocytes Absolute 0.3 0.1 - 1.0 K/uL   Eosinophils Relative 1 0 - 5 %   Eosinophils Absolute 0.1 0.0 - 0.7 K/uL   Basophils Relative 0 0 - 1 %   Basophils Absolute 0.0 0.0 - 0.1 K/uL  hCG, quantitative, pregnancy     Status: Abnormal   Collection Time: 10/28/14  7:02 PM  Result Value Ref Range   hCG, Beta Chain, Quant, S [id number] (H) <5 mIU/mL  Wet prep, genital     Status: None   Collection Time: 10/28/14  7:18  PM  Result Value Ref Range   Yeast Wet Prep HPF POC NONE SEEN NONE SEEN   Trich, Wet Prep NONE SEEN NONE SEEN   Clue Cells Wet Prep HPF POC NONE SEEN NONE SEEN   WBC, Wet Prep HPF POC NONE SEEN NONE SEEN    MAU Course  Procedures  MDM Work up to r/o Ectopic including labs, U/S, pelvic.  U/s confirms IUP but also subchorionic.    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

## 2014-10-28 NOTE — MAU Note (Signed)
abd pain and low back pain, ongoing- cramping last few days.  Spotting today. No menstrual, few wks late. No HPT

## 2014-10-29 LAB — GC/CHLAMYDIA PROBE AMP (~~LOC~~) NOT AT ARMC
CHLAMYDIA, DNA PROBE: NEGATIVE
NEISSERIA GONORRHEA: NEGATIVE

## 2014-10-29 LAB — HIV ANTIBODY (ROUTINE TESTING W REFLEX): HIV SCREEN 4TH GENERATION: NONREACTIVE

## 2014-10-29 LAB — RPR: RPR Ser Ql: NONREACTIVE

## 2014-11-03 DIAGNOSIS — O99321 Drug use complicating pregnancy, first trimester: Secondary | ICD-10-CM

## 2014-11-19 ENCOUNTER — Encounter (HOSPITAL_COMMUNITY): Payer: Self-pay | Admitting: Cardiology

## 2014-11-19 ENCOUNTER — Emergency Department (HOSPITAL_COMMUNITY)
Admission: EM | Admit: 2014-11-19 | Discharge: 2014-11-19 | Disposition: A | Discharge status: Home / Self Care | Payer: Medicaid Other | Attending: Emergency Medicine | Admitting: Emergency Medicine

## 2014-11-19 DIAGNOSIS — Z87891 Personal history of nicotine dependence: Secondary | ICD-10-CM | POA: Diagnosis not present

## 2014-11-19 DIAGNOSIS — K088 Other specified disorders of teeth and supporting structures: Secondary | ICD-10-CM | POA: Insufficient documentation

## 2014-11-19 DIAGNOSIS — K029 Dental caries, unspecified: Secondary | ICD-10-CM | POA: Diagnosis not present

## 2014-11-19 DIAGNOSIS — Z8619 Personal history of other infectious and parasitic diseases: Secondary | ICD-10-CM | POA: Diagnosis not present

## 2014-11-19 MED ORDER — NAPROXEN 500 MG PO TABS: 500.0000 mg | ORAL_TABLET | Freq: Two times a day (BID) | ORAL | Status: DC

## 2014-11-19 MED ORDER — AMOXICILLIN 500 MG PO CAPS: 500.0000 mg | ORAL_CAPSULE | Freq: Three times a day (TID) | ORAL | Status: DC

## 2014-11-19 MED ORDER — HYDROCODONE-ACETAMINOPHEN 5-325 MG PO TABS
1.0000 | ORAL_TABLET | Freq: Once | ORAL | Status: AC
Start: 2014-11-19 — End: 2014-11-19
  Administered 2014-11-19: 1 via ORAL
  Filled 2014-11-19: qty 1

## 2014-11-19 NOTE — ED Notes (Signed)
Pt reports swelling to the right side of her face and pt reports a bad tooth on that side. Tried tylenol at home.

## 2014-11-19 NOTE — Discharge Instructions (Signed)
°Emergency Department Resource Guide °1) Find a Doctor and Pay Out of Pocket °Although you won't have to find out who is covered by your insurance plan, it is a good idea to ask around and get recommendations. You will then need to call the office and see if the doctor you have chosen will accept you as a new patient and what types of options they offer for patients who are self-pay. Some doctors offer discounts or will set up payment plans for their patients who do not have insurance, but you will need to ask so you aren't surprised when you get to your appointment. ° °2) Contact Your Local Health Department °Not all health departments have doctors that can see patients for sick visits, but many do, so it is worth a call to see if yours does. If you don't know where your local health department is, you can check in your phone book. The CDC also has a tool to help you locate your state's health department, and many state websites also have listings of all of their local health departments. ° °3) Find a Walk-in Clinic °If your illness is not likely to be very severe or complicated, you may want to try a walk in clinic. These are popping up all over the country in pharmacies, drugstores, and shopping centers. They're usually staffed by nurse practitioners or physician assistants that have been trained to treat common illnesses and complaints. They're usually fairly quick and inexpensive. However, if you have serious medical issues or chronic medical problems, these are probably not your best option. ° °No Primary Care Doctor: °- Call [facility] at  832-8000 - they can help you locate a primary care doctor that  accepts your insurance, provides certain services, etc. °- Physician Referral Service- 1-800-533-3463 ° °Chronic Pain Problems: °Organization         Address  Phone   Notes  °[facility]  (336) 297-2271 Patients need to be referred by their primary care doctor.  ° °Medication  Assistance: °Organization         Address  Phone   Notes  °[place] [facility] [address] °[place], [place] [address] (336) 641-8030 --Must be a resident of [place] °-- Must have NO insurance coverage whatsoever (no Medicaid/ Medicare, etc.) °-- The pt. MUST have a primary care doctor that directs their care regularly and follows them in the community °  °[facility]  (866) 331-1348   °[employer]  (888) 892-1162   ° °Agencies that provide inexpensive medical care: °Organization         Address  Phone   Notes  °[facility]  (336) 832-8035   °[facility]    (336) 832-7272   °[facility] [address] °[place], [place] [address] (336) 832-4777   °[facility] [address], °[place] (336) 271-4999   °[facility]    (336) 373-0678   °[facility]    (336) 272-1050   °[facility] ° [address], [place] Phone:  (336) 832-4444, Fax:  (336) 832-4440 Hours of Operation:  9 am - 6 pm, M-F.  Also accepts Medicaid/Medicare and self-pay.  °[facility] ° [address], [place] Phone: (336) 832-3150, Fax: (336) 832-3151. Hours of Operation:  8:30 am - 5:30 pm, M-F.  Also accepts Medicaid and self-pay.  °[facility] [place] [address], [place] Phone: (336) 878-6027   °[facility] [address], [place], [place] (336)723-1848, Ext. 123 Mondays & Thursdays: 7-9 AM.  First 15 patients are seen on a first come, first serve basis. °  ° °Medicaid-accepting [place] Providers: ° °Organization         Address  Phone   Notes  °[facility] [address], [place] (336) 641-2100 Also accepts self-pay patients.  °[facility] [address], [place] ° (336) 856-9996   °[facility] [address], [place]  (336) 288-8857   °[facility] [address] [place] [address], [place] (336) 299-7000   °Veita Bland [address], [place]  ° (336) 373-1557 Only accepts [place] Access Medicaid patients after they have their name applied to their card.  ° °Self-Pay (no insurance) in [place]: ° °Organization         Address  Phone   Notes  °Sickle Cell Patients, [facility] [address], [place] (336) 832-1970   °[facility] [address], [place] (336) 832-4400   °[facility] ° [address], [place] (336) 992-4800   °[facility]/Dr. Osei-Bonsu ° [address] [place] [address], [place] or [address], [place] (336) 841-8500 Phone number for both [place] and [place] locations is the same.  °[facility] [address], [place] (336) 299-0000   °[facility] [address] [place] [address], [place] or [address] (336) 852-7530 °(336) 878-2260   °[facility] [address], [place] (336) 350-1642, phone; (336) 294-5005, fax Sees patients 1st and 3rd Saturday of every month.  Must not qualify for public or private insurance (i.e. Medicaid, Medicare, [place] Health Choice, Veterans' Benefits) • Household income should be no more than 200% of the poverty level •The clinic cannot treat you if you are pregnant or think you are pregnant • Sexually transmitted diseases are not treated at the clinic.  ° ° °Dental Care: °Organization         Address  Phone  Notes  °[place] [facility] [address], [place] (336) 641-6152 Accepts children up to age 21 who are enrolled in Medicaid or [place] Health Choice; pregnant women with a Medicaid card; and children who have applied for Medicaid or [place] Health Choice, but were declined, whose parents can pay a reduced fee at time of service.  °[place]  [facility] [place]  [address], [place] (336) 641-7733 Accepts children up to age 21 who are enrolled in Medicaid or [place] Health Choice; pregnant women with a Medicaid card; and children who have applied for Medicaid or [place] Health Choice, but were declined, whose parents can pay a reduced fee at time of service.  °[facility] ° [address], [place] (336) 641-4533 Patients are seen by appointment only. Walk-ins are not accepted. [facility] will see patients 18 years of age and older. °Monday - Tuesday (8am-5pm) °Most Wednesdays (8:30-5pm) °$30 per visit, cash only  °[facility] ° [address], [place] (336) 641-4533 Patients are seen by appointment only. Walk-ins are not accepted. [facility] will see patients 18 years of age and older. °One   Wednesday Evening (Monthly: Volunteer Based).  $30 per visit, cash only  °[facility]  (919) 537-3737 for adults; Children under age 4, call Graduate Pediatric Dentistry at (919) [phone]. Children aged 4-14, please call (919) [phone] to request a pediatric application. ° Dental services are provided in all areas of dental care including fillings, crowns and bridges, complete and partial dentures, implants, gum treatment, root canals, and extractions. Preventive care is also provided. Treatment is provided to both adults and children. °Patients are selected via a lottery and there is often a waiting list. °  °[facility] [address], °[place] ° (336) 763-8833 [email or web address].drcivils.[email or web address] °  °[facility] [address], [place], [place] (336)723-1848, Ext. 123 Second and Fourth Thursday of each month, opens at 6:30 AM; Clinic ends at 9 AM.  Patients are seen on a first-come first-served basis, and a limited number are seen during each clinic.  ° °[facility] ° [address], [place], [place] (336) 723-7904    Eligibility Requirements °You must have lived in [place], [place], or [place] counties for at least the last three months. °  You cannot be eligible for state or federal sponsored healthcare insurance, including Veterans Administration, Medicaid, or Medicare. °  You generally cannot be eligible for healthcare insurance through your employer.  °  How to apply: °Eligibility screenings are held every Tuesday and Wednesday afternoon from 1:00 pm until 4:00 pm. You do not need an appointment for the interview!  °[facility] [address], [place], [place] 336-631-2330   °[facility]  336-342-8273   °[facility]  336-703-3100   °[facility]  336-570-6415   ° °Behavioral Health Resources in the Community: °Intensive Outpatient Programs °Organization         Address  Phone  Notes  °[facility] [address], [place], [place] 336-878-6098   °[facility] [address], [place], [place] 336-832-9800   °[facility] [address], [place], [place] ° 336-882-2125   °[facility] [address],  °[place], [place] 1-800-853-5163 or 336-641-4981   °Substance Abuse Resources °Organization         Address  Phone  Notes  °[facility]  336-882-2125   °[facility]  336-784-9470   °[facility]  336-285-9073   °[facility]  336-845-3988   °Residential & Outpatient Substance Abuse Program  1-800-659-3381   °Psychological Services °Organization         Address  Phone  Notes  °[facility]  336- 832-9600   °[facility]  336- 378-7881   °[facility] [address], [place] 1-800-853-5163 or 336-641-4981   ° °Mobile Crisis Teams °Organization         Address  Phone  Notes  °[facility], Mobile Crisis Care Unit  1-877-626-1772   °Assertive °[facility] ° [address]  [place], [place] 336-834-9664   °Sharon DeEsch [address] °[place] 336-554-5454   ° °Self-Help/Support Groups °Organization         Address  Phone             Notes  °[facility]  - variety of support groups  336- 373-1402 Call for more information  °Narcotics Anonymous (NA), [facility] [address], °[place]  2 meetings at this location  ° °  Residential Treatment Programs Organization         Address  Phone  Notes  [facility] [address]    [place]  1-610-960-4540   [facility]  [address] [place] [address], [place], [place] 478-295-6213   [facility] [address] [place], [place] 086-578-4696 Admissions: 8am-3pm M-F  [facility] [address],    [place], [place] 295-284-1324   [facility] [address] [place], [place], [place] 401-027-2536   [facility] [address],  [place], [place] 644-034-7425   [facility] Intensive Outpatient [address], [place], [place] 956-387-5643   [facility] ([facility] [address]  [place], [place] 3-295-188-4166 or 901-181-4234   [facility] [address], [place], [place] 323-557-3220 Accepts Medicaid  [facility] [address]  [place] 2-542-706-2376 Substance Abuse/Addiction Treatment   [facility] Organization         Address  Phone  Notes  [facility]  847-120-4444   Angie Fava, PhD [address] [place], [place]   (305)177-6437 or (774)112-5971   [facility]   [address] [place], [place] 920-229-6435   [facility] [address], [place], [place] [phone] Insurance/Medicaid/sponsorship through [facility] [address]                                    [place], [place] (204) 591-2804 Therapy/tele-psych/case    [facility] [address], [place] 804-304-9737    Dr. Lolly Mustache  5172821671   [facility]  [employer] [facility] Dept. 1) [address] [place] 2) [address], [place] 3)  [address], [place] [phone] 302-100-5106  9190119926   [facility] Child Abuse Hotline (731) 512-8679 or 239-339-7113 (After Hours)         Dental Caries Dental caries is tooth decay. This decay can cause a hole in teeth (cavity) that can get bigger and deeper over time. HOME CARE  Brush and floss your teeth. Do this at least two times a day.  Use a fluoride toothpaste.  Use a mouth rinse if told by your dentist or doctor.  Eat less sugary and starchy foods. Drink less sugary drinks.  Avoid snacking often on sugary and starchy foods. Avoid sipping often on sugary drinks.  Keep regular checkups and cleanings with your dentist.  Use fluoride supplements if told by your dentist or doctor.  Allow fluoride to be applied to teeth if told by your dentist or doctor. Document Released: 03/07/2008 Document Revised: 10/13/2013 Document Reviewed: 05/31/2012 [facility] Patient Information 2015 [place], [place]. This information is not intended to replace advice given to you by your health care provider. Make sure you discuss any questions you have with your health care provider.  Dental Pain A tooth ache may be caused by cavities (tooth decay). Cavities expose the nerve of the tooth to air and hot or cold temperatures. It may come from an infection or abscess (also called a boil or furuncle) around your tooth. It is also often caused by dental caries (tooth decay). This causes the pain you are having. DIAGNOSIS  Your caregiver can diagnose  this problem by exam. TREATMENT   If caused by an infection, it may be treated with medications which kill germs (antibiotics) and pain medications as prescribed by your caregiver. Take medications  as directed.  Only take over-the-counter or prescription medicines for pain, discomfort, or fever as directed by your caregiver.  Whether the tooth ache today is caused by infection or dental disease, you should see your dentist as soon as possible for further care. SEEK MEDICAL CARE IF: The exam and treatment you received today has been provided on an emergency basis only. This is not a substitute for complete medical or dental care. If your problem worsens or new problems (symptoms) appear, and you are unable to meet with your dentist, call or return to this location. SEEK IMMEDIATE MEDICAL CARE IF:   You have a fever.  You develop redness and swelling of your face, jaw, or neck.  You are unable to open your mouth.  You have severe pain uncontrolled by pain medicine. MAKE SURE YOU:   Understand these instructions.  Will watch your condition.  Will get help right away if you are not doing well or get worse. Document Released: 05/29/2005 Document Revised: 08/21/2011 Document Reviewed: 01/15/2008 [facility] Patient Information 2015 [place], [place]. This information is not intended to replace advice given to you by your health care provider. Make sure you discuss any questions you have with your health care provider.

## 2014-11-19 NOTE — ED Provider Notes (Signed)
CSN: [id number]     Arrival date & time 11/19/14  1835 History  This chart was scribed for non-physician practitioner Kerrie Buffalo, NP working with Raeford Razor, MD by Lyndel Safe, ED Scribe. This patient was seen in room TR08C/TR08C and the patient's care was started at 7:24 PM.  Chief Complaint  Patient presents with  . Dental Pain  . Oral Swelling   Patient is a 34 y.o. female presenting with tooth pain. The history is provided by the patient. No language interpreter was used.  Dental Pain Location:  Upper Upper teeth location:  2/RU 2nd molar Quality:  Throbbing Severity:  Moderate Onset quality:  Sudden Duration:  2 days Timing:  Constant Progression:  Worsening Chronicity:  New Context: abscess   Relieved by:  Nothing Ineffective treatments:  Acetaminophen Associated symptoms: facial pain and facial swelling   Associated symptoms: no fever    HPI Comments: Anna Calhoun is a 34 y.o. female, with a PMhx of herpes and dental caries,  who presents to the Emergency Department complaining of a progressively worsening, constant, right-sided, throbbing facial pain and swelling onset this morning. She notes she has an abscess to the inner, right buccal region. She states that her upper, right teeth have been hurting for the past 2 days. She has taken tylenol with no relief. Pt has had dental issues previously and had 6 teeth pulled. She denies fever.  Past Medical History  Diagnosis Date  . Gonorrhea 1997  . Chlamydia 1997  . Medical history non-contributory   . Herpes    Past Surgical History  Procedure Laterality Date  . Cesarean section    . No past surgeries    . Cesarean section N/A 02/04/2013    Procedure: CESAREAN SECTION;  Surgeon: Kathreen Cosier, MD;  Location: [facility];  Service: Obstetrics;  Laterality: N/A;   Family History  Problem Relation Age of Onset  . Hypertension Mother   . Diabetes Father   . Cancer Maternal Grandfather   . Cancer Paternal  Grandmother    History  Substance Use Topics  . Smoking status: Former Games developer  . Smokeless tobacco: Never Used  . Alcohol Use: Yes     Comment: varies not since pregnancy   OB History    Gravida Para Term Preterm AB TAB SAB Ectopic Multiple Living   6 5 4 1  0     5     Review of Systems  Constitutional: Negative for fever.  HENT: Positive for dental problem and facial swelling.   all other systems negative    Allergies  Review of patient's allergies indicates no known allergies.  Home Medications   Prior to Admission medications   Medication Sig Start Date End Date Taking? Authorizing Provider  amoxicillin (AMOXIL) 500 MG capsule Take 1 capsule (500 mg total) by mouth 3 (three) times daily. 11/19/14   Amaka Gluth Orlene Och, NP  naproxen (NAPROSYN) 500 MG tablet Take 1 tablet (500 mg total) by mouth 2 (two) times daily. 11/19/14   Tell Rozelle Orlene Och, NP   BP 123/67 mmHg  Pulse 81  Temp(Src) 98.4 F (36.9 C) (Oral)  Resp 20  SpO2 100%  LMP 09/13/2014  Physical Exam  Constitutional: She is oriented to person, place, and time. She appears well-developed and well-nourished. No distress.  HENT:  Head: Normocephalic.  Right Ear: External ear normal.  Left Ear: External ear normal.  Mouth/Throat: Uvula is midline and oropharynx is clear and moist. No oropharyngeal exudate.  Second molar decayed gum surrounding the tooth with erythema.  Facial tenderness with minimal swelling noted.   Eyes: Pupils are equal, round, and reactive to light. Right eye exhibits no discharge. Left eye exhibits no discharge. No scleral icterus.  Neck: Neck supple.  Cardiovascular: Normal rate.   Pulmonary/Chest: Effort normal.  Musculoskeletal: She exhibits no edema.  Lymphadenopathy:    She has no cervical adenopathy.  Neurological: She is alert and oriented to person, place, and time.  Skin: Skin is warm and dry.  Psychiatric: She has a normal mood and affect. Her behavior is normal.  Nursing note and  vitals reviewed.   ED Course  Procedures  DIAGNOSTIC STUDIES: Oxygen Saturation is 100% on RA, normal by my interpretation.    COORDINATION OF CARE: 7:29 PM Discussed treatment plan which includes to order and prescribe pain medicaiton. Pt acknowledges and agrees to plan.   Labs Review Labs Reviewed - No data to display   MDM  34 y.o. female with dental pain due to caries. Stable for dc to follow up with a dentist as soon as possible. Discussed with the patient and all questioned fully answered. She will call me if any problems arise. Antibiotics and NSAIDS started.   Final diagnoses:  Pain due to dental caries    I personally performed the services described in this documentation, which was scribed in my presence. The recorded information has been reviewed and is accurate.    [address] [place], NP 11/19/14 1944  Raeford Razor, MD 11/21/14 [phone]

## 2014-12-09 LAB — CYTOLOGY - PAP: PAP SMEAR: NEGATIVE

## 2014-12-23 ENCOUNTER — Inpatient Hospital Stay (HOSPITAL_COMMUNITY)
Admission: AD | Admit: 2014-12-23 | Discharge: 2014-12-23 | Disposition: A | Discharge status: Home / Self Care | Payer: Medicaid Other | Source: Ambulatory Visit | Attending: Obstetrics | Admitting: Obstetrics

## 2014-12-23 ENCOUNTER — Encounter (HOSPITAL_COMMUNITY): Payer: Self-pay | Admitting: *Deleted

## 2014-12-23 DIAGNOSIS — Z3A14 14 weeks gestation of pregnancy: Secondary | ICD-10-CM | POA: Insufficient documentation

## 2014-12-23 DIAGNOSIS — R109 Unspecified abdominal pain: Secondary | ICD-10-CM | POA: Insufficient documentation

## 2014-12-23 DIAGNOSIS — O9989 Other specified diseases and conditions complicating pregnancy, childbirth and the puerperium: Secondary | ICD-10-CM | POA: Insufficient documentation

## 2014-12-23 DIAGNOSIS — Z87891 Personal history of nicotine dependence: Secondary | ICD-10-CM | POA: Diagnosis not present

## 2014-12-23 DIAGNOSIS — N898 Other specified noninflammatory disorders of vagina: Secondary | ICD-10-CM

## 2014-12-23 DIAGNOSIS — O99322 Drug use complicating pregnancy, second trimester: Secondary | ICD-10-CM | POA: Insufficient documentation

## 2014-12-23 DIAGNOSIS — O26891 Other specified pregnancy related conditions, first trimester: Secondary | ICD-10-CM

## 2014-12-23 DIAGNOSIS — F1490 Cocaine use, unspecified, uncomplicated: Secondary | ICD-10-CM | POA: Insufficient documentation

## 2014-12-23 DIAGNOSIS — O26899 Other specified pregnancy related conditions, unspecified trimester: Secondary | ICD-10-CM

## 2014-12-23 DIAGNOSIS — F1410 Cocaine abuse, uncomplicated: Secondary | ICD-10-CM

## 2014-12-23 DIAGNOSIS — O99320 Drug use complicating pregnancy, unspecified trimester: Secondary | ICD-10-CM

## 2014-12-23 LAB — RAPID URINE DRUG SCREEN, HOSP PERFORMED
AMPHETAMINES: NOT DETECTED
BARBITURATES: NOT DETECTED
Benzodiazepines: NOT DETECTED
Cocaine: POSITIVE — AB
OPIATES: NOT DETECTED
TETRAHYDROCANNABINOL: NOT DETECTED

## 2014-12-23 LAB — WET PREP, GENITAL
Clue Cells Wet Prep HPF POC: NONE SEEN
TRICH WET PREP: NONE SEEN
YEAST WET PREP: NONE SEEN

## 2014-12-23 LAB — URINALYSIS, ROUTINE W REFLEX MICROSCOPIC
BILIRUBIN URINE: NEGATIVE
Glucose, UA: NEGATIVE mg/dL
Hgb urine dipstick: NEGATIVE
KETONES UR: NEGATIVE mg/dL
Leukocytes, UA: NEGATIVE
NITRITE: NEGATIVE
PH: 7.5 (ref 5.0–8.0)
Protein, ur: NEGATIVE mg/dL
SPECIFIC GRAVITY, URINE: 1.02 (ref 1.005–1.030)
UROBILINOGEN UA: 0.2 mg/dL (ref 0.0–1.0)

## 2014-12-23 NOTE — Discharge Instructions (Signed)

## 2014-12-23 NOTE — MAU Provider Note (Signed)
History     CSN: [id number]  Arrival date and time: 12/23/14 1102   First Provider Initiated Contact with Patient 12/23/14 1152      Chief Complaint  Patient presents with  . Abdominal Pain   HPI Anna Calhoun 34 y.o. [id number]  presents to MAU complaining of vaginal discharge, itching and abdominal pain.  These symptoms started yesterday afternoon.  It felt better overnight but started again this morning.  Pain is 8/10 and she has not used any medication to treat.  It comes and goes.  It only lasts a couple of seconds and has occurred just a few times today.  The discharge and itching started yesterday also.  She started monistat and vagisil yesterday.  She denies odor, vaginal bleeding, dysuria, CP, SOB.   She reports occasional fetal movement, despite only [redacted] weeks gestation.   OB History    Gravida Para Term Preterm AB TAB SAB Ectopic Multiple Living   0     5      Past Medical History  Diagnosis Date  . Gonorrhea 1997  . Chlamydia 1997  . Medical history non-contributory   . Herpes     Past Surgical History  Procedure Laterality Date  . Cesarean section    . No past surgeries    . Cesarean section N/A 02/04/2013    Procedure: CESAREAN SECTION;  Surgeon: Kathreen Cosier, MD;  Location: [facility];  Service: Obstetrics;  Laterality: N/A;    Family History  Problem Relation Age of Onset  . Hypertension Mother   . Diabetes Father   . Cancer Maternal Grandfather   . Cancer Paternal Grandmother     History  Substance Use Topics  . Smoking status: Former Games developer  . Smokeless tobacco: Never Used  . Alcohol Use: Yes     Comment: varies not since pregnancy    Allergies: No Known Allergies  Prescriptions prior to admission  Medication Sig Dispense Refill Last Dose  . Prenatal Vit-Fe Fumarate-FA (PRENATAL MULTIVITAMIN) TABS tablet Take 1 tablet by mouth daily at 12 noon.   12/22/2014 at Unknown time  . amoxicillin (AMOXIL) 500 MG capsule Take 1 capsule  (500 mg total) by mouth 3 (three) times daily. (Patient not taking: Reported on 12/23/2014) 21 capsule 0   . naproxen (NAPROSYN) 500 MG tablet Take 1 tablet (500 mg total) by mouth 2 (two) times daily. (Patient not taking: Reported on 12/23/2014) 30 tablet 0     ROS Pertinent ROS in HPI.  All other systems are negative.   Physical Exam   Blood pressure 130/69, pulse 98, temperature 98 F (36.7 C), temperature source Oral, resp. rate 18, height  (1.549 m), weight 176 lb (79.833 kg), last menstrual period 09/13/2014, unknown if currently breastfeeding.  Physical Exam  Constitutional: She is oriented to person, place, and time. She appears well-developed and well-nourished. No distress.  HENT:  Head: Normocephalic and atraumatic.  Eyes: EOM are normal.  Neck: Normal range of motion.  Cardiovascular: Normal rate.   Respiratory: Effort normal and breath sounds normal. No respiratory distress.  GI: Soft. She exhibits no distension. There is no tenderness.  Genitourinary:  Large amt of white thick creamy discharge - likely monistat No CMT.  NO adnexal tenderness  Musculoskeletal: Normal range of motion.  Neurological: She is alert and oriented to person, place, and time.  Skin: Skin is warm and dry.  Psychiatric: She has a normal mood and affect.   Results for  orders placed or performed during the hospital encounter of 12/23/14 (from the past 48 hour(s))  Urinalysis, Routine w reflex microscopic (not at [facility])     Status: Abnormal   Collection Time: 12/23/14 11:45 AM  Result Value Ref Range   Color, Urine YELLOW YELLOW   APPearance CLOUDY (A) CLEAR   Specific Gravity, Urine 1.020 1.005 - 1.030   pH 7.5 5.0 - 8.0   Glucose, UA NEGATIVE NEGATIVE mg/dL   Hgb urine dipstick NEGATIVE NEGATIVE   Bilirubin Urine NEGATIVE NEGATIVE   Ketones, ur NEGATIVE NEGATIVE mg/dL   Protein, ur NEGATIVE NEGATIVE mg/dL   Urobilinogen, UA 0.2 0.0 - 1.0 mg/dL   Nitrite NEGATIVE NEGATIVE   Leukocytes,  UA NEGATIVE NEGATIVE    Comment: MICROSCOPIC NOT DONE ON URINES WITH NEGATIVE PROTEIN, BLOOD, LEUKOCYTES, NITRITE, OR GLUCOSE <1000 mg/dL.  Urine rapid drug screen (hosp performed)     Status: Abnormal   Collection Time: 12/23/14 11:45 AM  Result Value Ref Range   Opiates NONE DETECTED NONE DETECTED   Cocaine POSITIVE (A) NONE DETECTED   Benzodiazepines NONE DETECTED NONE DETECTED   Amphetamines NONE DETECTED NONE DETECTED   Tetrahydrocannabinol NONE DETECTED NONE DETECTED   Barbiturates NONE DETECTED NONE DETECTED    Comment:        DRUG SCREEN FOR MEDICAL PURPOSES ONLY.  IF CONFIRMATION IS NEEDED FOR ANY PURPOSE, NOTIFY LAB WITHIN 5 DAYS.        LOWEST DETECTABLE LIMITS FOR URINE DRUG SCREEN Drug Class       Cutoff (ng/mL) Amphetamine      1000 Barbiturate      200 Benzodiazepine   200 Tricyclics       300 Opiates          300 Cocaine          300 THC              50   Wet prep, genital     Status: Abnormal   Collection Time: 12/23/14 12:05 PM  Result Value Ref Range   Yeast Wet Prep HPF POC NONE SEEN NONE SEEN   Trich, Wet Prep NONE SEEN NONE SEEN   Clue Cells Wet Prep HPF POC NONE SEEN NONE SEEN   WBC, Wet Prep HPF POC FEW (A) NONE SEEN    Comment: MODERATE BACTERIA SEEN    MAU Course  Procedures  MDM NO clear infection noted but this is likely due to use of monistat last night.   Fetal well being confirmed with heart tones on triage  Assessment and Plan  A"  1. Vaginal discharge in pregnancy in first trimester   2. Abdominal pain in pregnancy   3.      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 Gaynell FaceMarshall as scheduled Cocaine positive today.  Advised against all use of cocaine Patient may return to MAU as needed or if her condition were to change or worsen   Bertram Denvereague Clark, Iliana Hutt E 12/23/2014, 11:53 AM

## 2014-12-23 NOTE — MAU Note (Signed)
Patient presents at [redacted] weeks gestation with c/o lower abdominal pain since yesterday. Fetus active. Denies bleeding but states she does have discharge and itching.

## 2014-12-24 LAB — GC/CHLAMYDIA PROBE AMP (~~LOC~~) NOT AT ARMC
Chlamydia: NEGATIVE
Neisseria Gonorrhea: NEGATIVE

## 2015-05-17 ENCOUNTER — Other Ambulatory Visit: Payer: Self-pay | Admitting: Obstetrics

## 2015-06-08 ENCOUNTER — Inpatient Hospital Stay (HOSPITAL_COMMUNITY): Payer: Medicaid Other | Admitting: Anesthesiology

## 2015-06-08 ENCOUNTER — Encounter (HOSPITAL_COMMUNITY): Payer: Self-pay

## 2015-06-08 ENCOUNTER — Inpatient Hospital Stay (HOSPITAL_COMMUNITY)
Admission: AD | Admit: 2015-06-08 | Discharge: 2015-06-11 | DRG: 766 | Disposition: A | Discharge status: Home / Self Care | Payer: Medicaid Other | Source: Ambulatory Visit | Attending: Obstetrics | Admitting: Obstetrics

## 2015-06-08 ENCOUNTER — Encounter (HOSPITAL_COMMUNITY)
Admission: AD | Disposition: A | Discharge status: Home / Self Care | Payer: Self-pay | Source: Ambulatory Visit | Attending: Obstetrics

## 2015-06-08 DIAGNOSIS — Z87891 Personal history of nicotine dependence: Secondary | ICD-10-CM | POA: Diagnosis not present

## 2015-06-08 DIAGNOSIS — Z3A39 39 weeks gestation of pregnancy: Secondary | ICD-10-CM | POA: Diagnosis not present

## 2015-06-08 DIAGNOSIS — O34211 Maternal care for low transverse scar from previous cesarean delivery: Secondary | ICD-10-CM | POA: Diagnosis present

## 2015-06-08 DIAGNOSIS — Z98891 History of uterine scar from previous surgery: Secondary | ICD-10-CM

## 2015-06-08 LAB — CBC
HEMATOCRIT: 30.7 % — AB (ref 36.0–46.0)
Hemoglobin: 10.1 g/dL — ABNORMAL LOW (ref 12.0–15.0)
MCH: 27.8 pg (ref 26.0–34.0)
MCHC: 32.9 g/dL (ref 30.0–36.0)
MCV: 84.6 fL (ref 78.0–100.0)
PLATELETS: 226 10*3/uL (ref 150–400)
RBC: 3.63 MIL/uL — ABNORMAL LOW (ref 3.87–5.11)
RDW: 14.4 % (ref 11.5–15.5)
WBC: 8.4 10*3/uL (ref 4.0–10.5)

## 2015-06-08 LAB — TYPE AND SCREEN
ABO/RH(D): A POS
Antibody Screen: NEGATIVE

## 2015-06-08 LAB — POCT FERN TEST: POCT Fern Test: NEGATIVE

## 2015-06-08 LAB — AMNISURE RUPTURE OF MEMBRANE (ROM) NOT AT ARMC: Amnisure ROM: POSITIVE

## 2015-06-08 SURGERY — Surgical Case
Anesthesia: Spinal

## 2015-06-08 MED ORDER — MORPHINE SULFATE (PF) 0.5 MG/ML IJ SOLN
INTRAMUSCULAR | Status: AC
  Filled 2015-06-08: qty 10

## 2015-06-08 MED ORDER — CEFAZOLIN (ANCEF) 1 G IV SOLR: 2.0000 g | INTRAVENOUS | Status: DC

## 2015-06-08 MED ORDER — KETOROLAC TROMETHAMINE 30 MG/ML IJ SOLN
30.0000 mg | Freq: Four times a day (QID) | INTRAMUSCULAR | Status: AC | PRN
  Administered 2015-06-09: 30 mg via INTRAVENOUS

## 2015-06-08 MED ORDER — MORPHINE SULFATE (PF) 0.5 MG/ML IJ SOLN
INTRAMUSCULAR | Status: DC | PRN
  Administered 2015-06-08: .2 mg via INTRATHECAL

## 2015-06-08 MED ORDER — FENTANYL CITRATE (PF) 100 MCG/2ML IJ SOLN
INTRAMUSCULAR | Status: AC
  Filled 2015-06-08: qty 2

## 2015-06-08 MED ORDER — ONDANSETRON HCL 4 MG/2ML IJ SOLN
INTRAMUSCULAR | Status: AC
  Filled 2015-06-08: qty 2

## 2015-06-08 MED ORDER — KETOROLAC TROMETHAMINE 30 MG/ML IJ SOLN
30.0000 mg | Freq: Four times a day (QID) | INTRAMUSCULAR | Status: AC | PRN
  Filled 2015-06-08: qty 1

## 2015-06-08 MED ORDER — HYDROMORPHONE HCL 1 MG/ML IJ SOLN
INTRAMUSCULAR | Status: AC
  Filled 2015-06-08: qty 1

## 2015-06-08 MED ORDER — LACTATED RINGERS IV SOLN
40.0000 [IU] | INTRAVENOUS | Status: DC | PRN
  Administered 2015-06-08: 40 [IU] via INTRAVENOUS

## 2015-06-08 MED ORDER — PROMETHAZINE HCL 25 MG/ML IJ SOLN: 6.2500 mg | INTRAMUSCULAR | Status: DC | PRN

## 2015-06-08 MED ORDER — LACTATED RINGERS IV SOLN
INTRAVENOUS | Status: DC | PRN
  Administered 2015-06-08: 22:00:00 via INTRAVENOUS

## 2015-06-08 MED ORDER — MEPERIDINE HCL 25 MG/ML IJ SOLN: 6.2500 mg | INTRAMUSCULAR | Status: DC | PRN

## 2015-06-08 MED ORDER — PHENYLEPHRINE 8 MG IN D5W 100 ML (0.08MG/ML) PREMIX OPTIME
INJECTION | INTRAVENOUS | Status: DC | PRN
  Administered 2015-06-08: 60 ug/min via INTRAVENOUS

## 2015-06-08 MED ORDER — FAMOTIDINE IN NACL 20-0.9 MG/50ML-% IV SOLN
20.0000 mg | Freq: Once | INTRAVENOUS | Status: AC
Start: 2015-06-08 — End: 2015-06-08
  Administered 2015-06-08: 20 mg via INTRAVENOUS
  Filled 2015-06-08: qty 50

## 2015-06-08 MED ORDER — BUPIVACAINE IN DEXTROSE 0.75-8.25 % IT SOLN
INTRATHECAL | Status: DC | PRN
  Administered 2015-06-08: 1.6 mL via INTRATHECAL

## 2015-06-08 MED ORDER — SCOPOLAMINE 1 MG/3DAYS TD PT72
MEDICATED_PATCH | TRANSDERMAL | Status: AC
Start: 2015-06-08 — End: 2015-06-08
  Filled 2015-06-08: qty 1

## 2015-06-08 MED ORDER — CEFAZOLIN SODIUM-DEXTROSE 2-3 GM-% IV SOLR
2.0000 g | Freq: Once | INTRAVENOUS | Status: AC
  Administered 2015-06-08: 2 g via INTRAVENOUS
  Filled 2015-06-08: qty 50

## 2015-06-08 MED ORDER — CITRIC ACID-SODIUM CITRATE 334-500 MG/5ML PO SOLN
30.0000 mL | Freq: Once | ORAL | Status: AC
  Administered 2015-06-08: 30 mL via ORAL
  Filled 2015-06-08: qty 15

## 2015-06-08 MED ORDER — OXYTOCIN 10 UNIT/ML IJ SOLN
INTRAMUSCULAR | Status: AC
  Filled 2015-06-08: qty 4

## 2015-06-08 MED ORDER — FENTANYL CITRATE (PF) 100 MCG/2ML IJ SOLN
INTRAMUSCULAR | Status: DC | PRN
  Administered 2015-06-08: 12.5 ug via INTRATHECAL

## 2015-06-08 MED ORDER — SCOPOLAMINE 1 MG/3DAYS TD PT72
MEDICATED_PATCH | TRANSDERMAL | Status: DC | PRN
  Administered 2015-06-08: 1 via TRANSDERMAL

## 2015-06-08 MED ORDER — LACTATED RINGERS IV BOLUS (SEPSIS): 1000.0000 mL | Freq: Once | INTRAVENOUS | Status: DC

## 2015-06-08 MED ORDER — HYDROMORPHONE HCL 1 MG/ML IJ SOLN
0.2500 mg | INTRAMUSCULAR | Status: DC | PRN
  Administered 2015-06-08 (×2): 0.5 mg via INTRAVENOUS

## 2015-06-08 MED ORDER — KETOROLAC TROMETHAMINE 30 MG/ML IJ SOLN: 30.0000 mg | Freq: Once | INTRAMUSCULAR | Status: AC | PRN

## 2015-06-08 MED ORDER — PHENYLEPHRINE 8 MG IN D5W 100 ML (0.08MG/ML) PREMIX OPTIME
INJECTION | INTRAVENOUS | Status: AC
  Filled 2015-06-08: qty 100

## 2015-06-08 MED ORDER — LACTATED RINGERS IV SOLN
INTRAVENOUS | Status: DC
  Administered 2015-06-08 (×3): via INTRAVENOUS

## 2015-06-08 MED ORDER — ONDANSETRON HCL 4 MG/2ML IJ SOLN
INTRAMUSCULAR | Status: DC | PRN
  Administered 2015-06-08: 4 mg via INTRAVENOUS

## 2015-06-08 SURGICAL SUPPLY — 36 items
CLAMP CORD UMBIL (MISCELLANEOUS) IMPLANT
CLOTH BEACON ORANGE TIMEOUT ST (SAFETY) ×3 IMPLANT
DRAPE SHEET LG 3/4 BI-LAMINATE (DRAPES) IMPLANT
DRSG OPSITE POSTOP 4X10 (GAUZE/BANDAGES/DRESSINGS) ×3 IMPLANT
DURAPREP 26ML APPLICATOR (WOUND CARE) ×3 IMPLANT
ELECT REM PT RETURN 9FT ADLT (ELECTROSURGICAL) ×3
ELECTRODE REM PT RTRN 9FT ADLT (ELECTROSURGICAL) ×1 IMPLANT
EXTRACTOR VACUUM M CUP 4 TUBE (SUCTIONS) IMPLANT
EXTRACTOR VACUUM M CUP 4' TUBE (SUCTIONS)
GLOVE BIO SURGEON STRL SZ8.5 (GLOVE) ×3 IMPLANT
GLOVE BIOGEL PI IND STRL 7.0 (GLOVE) ×1 IMPLANT
GLOVE BIOGEL PI INDICATOR 7.0 (GLOVE) ×2
GOWN STRL REUS W/TWL 2XL LVL3 (GOWN DISPOSABLE) ×3 IMPLANT
GOWN STRL REUS W/TWL LRG LVL3 (GOWN DISPOSABLE) ×3 IMPLANT
KIT ABG SYR 3ML LUER SLIP (SYRINGE) IMPLANT
NEEDLE HYPO 25X5/8 SAFETYGLIDE (NEEDLE) IMPLANT
NS IRRIG 1000ML POUR BTL (IV SOLUTION) ×3 IMPLANT
PACK C SECTION WH (CUSTOM PROCEDURE TRAY) ×3 IMPLANT
PAD ABD DERMACEA PRESS 5X9 (GAUZE/BANDAGES/DRESSINGS) ×3 IMPLANT
PAD OB MATERNITY 4.3X12.25 (PERSONAL CARE ITEMS) ×3 IMPLANT
PENCIL SMOKE EVAC W/HOLSTER (ELECTROSURGICAL) ×3 IMPLANT
RTRCTR C-SECT PINK 25CM LRG (MISCELLANEOUS) ×3 IMPLANT
STAPLER VISISTAT 35W (STAPLE) ×3 IMPLANT
SUT CHROMIC 0 CT 802H (SUTURE) ×3 IMPLANT
SUT CHROMIC 0 MO4 CR (SUTURE) IMPLANT
SUT CHROMIC 1 CTX 36 (SUTURE) ×9 IMPLANT
SUT CHROMIC 2 0 SH (SUTURE) ×3 IMPLANT
SUT GUT PLAIN 0 CT-3 TAN 27 (SUTURE) IMPLANT
SUT MON AB 4-0 PS1 27 (SUTURE) ×3 IMPLANT
SUT PDS AB 0 CTX 36 PDP370T (SUTURE) IMPLANT
SUT VIC AB 0 CT1 18XCR BRD8 (SUTURE) IMPLANT
SUT VIC AB 0 CT1 8-18 (SUTURE)
SUT VIC AB 0 CTX 36 (SUTURE) ×4
SUT VIC AB 0 CTX36XBRD ANBCTRL (SUTURE) ×2 IMPLANT
TOWEL OR 17X24 6PK STRL BLUE (TOWEL DISPOSABLE) ×3 IMPLANT
TRAY FOLEY CATH SILVER 14FR (SET/KITS/TRAYS/PACK) ×3 IMPLANT

## 2015-06-08 NOTE — H&P (Signed)
This is Dr. Francoise CeoBernard Vinayak Bobier dictating the history and physical on  Anna Calhoun  she is a 34 year old gravida 6 para 09/11/2003 at 38 weeks and 4 days [facility] GBS negative patient does had 2 previous C-sections and she ruptured her membranes spontaneously tonight having occasional contractions and is for repeat C-section Past medical history negative Past surgical history negative negative except for 2 previous C-sections Social history negative System review negative Family history negative Physical exam well-developed female at 38+ weeks with spontaneous ruptured membranes fluids clear HEENT negative Lungs clear to P&A Heart regular rhythm no murmurs no gallops Breasts negative Abdomen term Pelvic as described above Extremities negative

## 2015-06-08 NOTE — Op Note (Signed)
Preop diagnosis previous cesarean section 2 at 38 weeks and 4 days with ruptured membranes Repeat low transverse cesarean section Surgeon Dr. Francoise CeoBernard Fermina Mishkin Anesthesia spinal Procedure patient placed on the operating table in the supine position abdomen prepped and draped bladder emptied with a Foley catheter a transverse suprapubic incision made through the old scar carried down to the rectus fascia fascia cleaned and incised length of the incision recti muscles retracted laterally peritoneum incised longitudinally transverse incision made in the visceroperitoneum above the bladder bladder mobilized inferiorly transverse lower uterine incision made placenta was anterior low-lying and she delivered of a female Apgar 9 and 9 fluid was clear the placenta removed manually uterine cavity clean with dry laps uterine incision closed in one layer with continuous suture of #1 chromic hemostasis satisfactory lap and sponge counts correct abdomen closed in layers peritoneum continuous with of 0 chromic fascia continuous with of 0 Dexon and the skin closed with   staples blood loss 500 cc

## 2015-06-08 NOTE — MAU Note (Signed)
Pt presents via EMS stating her water broke at 1640. Scheduled for repeat c/s. Reports good fetal movement. Occasional contractions.

## 2015-06-08 NOTE — Anesthesia Preprocedure Evaluation (Signed)
Anesthesia Evaluation  Patient identified by MRN, date of birth, ID band Patient awake    Reviewed: Allergy & Precautions, H&P , NPO status , Patient's Chart, lab work & pertinent test results  Airway Mallampati: I  TM Distance: >3 FB Neck ROM: full    Dental no notable dental hx.    Pulmonary former smoker,    Pulmonary exam normal        Cardiovascular negative cardio ROS Normal cardiovascular exam     Neuro/Psych negative neurological ROS  negative psych ROS   GI/Hepatic negative GI ROS, Neg liver ROS,   Endo/Other  negative endocrine ROS  Renal/GU negative Renal ROS     Musculoskeletal   Abdominal (+) + obese,   Peds  Hematology negative hematology ROS (+)   Anesthesia Other Findings   Reproductive/Obstetrics (+) Pregnancy                             Anesthesia Physical Anesthesia Plan  ASA: II  Anesthesia Plan: Spinal   Post-op Pain Management:    Induction:   Airway Management Planned:   Additional Equipment:   Intra-op Plan:   Post-operative Plan:   Informed Consent: I have reviewed the patients History and Physical, chart, labs and discussed the procedure including the risks, benefits and alternatives for the proposed anesthesia with the patient or authorized representative who has indicated his/her understanding and acceptance.     Plan Discussed with: CRNA and Surgeon  Anesthesia Plan Comments:         Anesthesia Quick Evaluation

## 2015-06-08 NOTE — Anesthesia Procedure Notes (Signed)
Spinal Patient location during procedure: OR Start time: 06/08/2015 9:08 PM End time: 06/08/2015 9:11 PM Staffing Anesthesiologist: Leilani AbleHATCHETT, Juanya Villavicencio Performed by: anesthesiologist  Preanesthetic Checklist Completed: patient identified, surgical consent, pre-op evaluation, timeout performed, IV checked, risks and benefits discussed and monitors and equipment checked Spinal Block Patient position: sitting Prep: site prepped and draped and DuraPrep Patient monitoring: heart rate, cardiac monitor, continuous pulse ox and blood pressure Approach: midline Location: L3-4 Injection technique: single-shot Needle Needle type: Pencan  Needle gauge: 24 G Needle length: 9 cm Needle insertion depth: 4 cm Assessment Sensory level: T4

## 2015-06-08 NOTE — Transfer of Care (Signed)
Immediate Anesthesia Transfer of Care Note  Patient: Anna Calhoun  Procedure(s) Performed: Procedure(s): CESAREAN SECTION (N/A)  Patient Location: PACU  Anesthesia Type:Spinal  Level of Consciousness: awake, alert  and oriented  Airway & Oxygen Therapy: Patient Spontanous Breathing  Post-op Assessment: Report given to RN and Post -op Vital signs reviewed and stable  Post vital signs: Reviewed and stable  Last Vitals:  Filed Vitals:   06/08/15 1832  BP: 111/68  Pulse: 94    Complications: No apparent anesthesia complications

## 2015-06-08 NOTE — Anesthesia Postprocedure Evaluation (Signed)
Anesthesia Post Note  Patient: Anna Calhoun  Procedure(s) Performed: Procedure(s) (LRB): CESAREAN SECTION (N/A)  Patient location during evaluation: PACU Anesthesia Type: Spinal Level of consciousness: awake Pain management: pain level controlled Vital Signs Assessment: post-procedure vital signs reviewed and stable Respiratory status: spontaneous breathing Cardiovascular status: stable Postop Assessment: no headache, no backache, spinal receding, patient able to bend at knees and no signs of nausea or vomiting Anesthetic complications: no    Last Vitals:  Filed Vitals:   06/08/15 2315 06/08/15 2330  BP: 122/62   Pulse: 67 62  Temp: 36.4 C   Resp: 17 17    Last Pain:  Filed Vitals:   06/08/15 2331  PainSc: 9                  Taegan Haider JR,JOHN Darsh Vandevoort

## 2015-06-08 NOTE — MAU Note (Signed)
Fern negative. Anna Calhoun. Lawson CNM notified. Ordered amnisure

## 2015-06-09 ENCOUNTER — Encounter (HOSPITAL_COMMUNITY): Payer: Self-pay | Admitting: Obstetrics

## 2015-06-09 DIAGNOSIS — Z98891 History of uterine scar from previous surgery: Secondary | ICD-10-CM

## 2015-06-09 LAB — CBC
HCT: 28.2 % — ABNORMAL LOW (ref 36.0–46.0)
HEMOGLOBIN: 9.1 g/dL — AB (ref 12.0–15.0)
MCH: 27.5 pg (ref 26.0–34.0)
MCHC: 32.3 g/dL (ref 30.0–36.0)
MCV: 85.2 fL (ref 78.0–100.0)
Platelets: 210 10*3/uL (ref 150–400)
RBC: 3.31 MIL/uL — AB (ref 3.87–5.11)
RDW: 14.4 % (ref 11.5–15.5)
WBC: 9.7 10*3/uL (ref 4.0–10.5)

## 2015-06-09 LAB — RPR: RPR Ser Ql: NONREACTIVE

## 2015-06-09 MED ORDER — SODIUM CHLORIDE 0.9 % IJ SOLN: 3.0000 mL | INTRAMUSCULAR | Status: DC | PRN

## 2015-06-09 MED ORDER — SIMETHICONE 80 MG PO CHEW
80.0000 mg | CHEWABLE_TABLET | ORAL | Status: DC | PRN
  Administered 2015-06-11: 80 mg via ORAL
  Filled 2015-06-09: qty 1

## 2015-06-09 MED ORDER — LANOLIN HYDROUS EX OINT: 1.0000 "application " | TOPICAL_OINTMENT | CUTANEOUS | Status: DC | PRN

## 2015-06-09 MED ORDER — LACTATED RINGERS IV SOLN
INTRAVENOUS | Status: DC
  Administered 2015-06-09: 08:00:00 via INTRAVENOUS

## 2015-06-09 MED ORDER — OXYCODONE-ACETAMINOPHEN 5-325 MG PO TABS
2.0000 | ORAL_TABLET | ORAL | Status: DC | PRN
  Administered 2015-06-09 – 2015-06-11 (×9): 2 via ORAL
  Filled 2015-06-09 (×9): qty 2

## 2015-06-09 MED ORDER — PRENATAL MULTIVITAMIN CH
1.0000 | ORAL_TABLET | Freq: Every day | ORAL | Status: DC
  Administered 2015-06-09 – 2015-06-11 (×3): 1 via ORAL
  Filled 2015-06-09 (×3): qty 1

## 2015-06-09 MED ORDER — SIMETHICONE 80 MG PO CHEW
80.0000 mg | CHEWABLE_TABLET | ORAL | Status: DC
  Administered 2015-06-10 (×2): 80 mg via ORAL
  Filled 2015-06-09 (×2): qty 1

## 2015-06-09 MED ORDER — DIPHENHYDRAMINE HCL 50 MG/ML IJ SOLN: 12.5000 mg | INTRAMUSCULAR | Status: DC | PRN

## 2015-06-09 MED ORDER — SIMETHICONE 80 MG PO CHEW
80.0000 mg | CHEWABLE_TABLET | Freq: Three times a day (TID) | ORAL | Status: DC
  Administered 2015-06-09 – 2015-06-11 (×8): 80 mg via ORAL
  Filled 2015-06-09 (×8): qty 1

## 2015-06-09 MED ORDER — IBUPROFEN 600 MG PO TABS
600.0000 mg | ORAL_TABLET | Freq: Four times a day (QID) | ORAL | Status: DC
  Administered 2015-06-09 – 2015-06-11 (×9): 600 mg via ORAL
  Filled 2015-06-09 (×11): qty 1

## 2015-06-09 MED ORDER — NALBUPHINE HCL 10 MG/ML IJ SOLN
5.0000 mg | INTRAMUSCULAR | Status: DC | PRN
  Filled 2015-06-09: qty 1

## 2015-06-09 MED ORDER — DIPHENHYDRAMINE HCL 25 MG PO CAPS: 25.0000 mg | ORAL_CAPSULE | Freq: Four times a day (QID) | ORAL | Status: DC | PRN

## 2015-06-09 MED ORDER — NALOXONE HCL 2 MG/2ML IJ SOSY
1.0000 ug/kg/h | PREFILLED_SYRINGE | INTRAMUSCULAR | Status: DC | PRN
  Filled 2015-06-09: qty 2

## 2015-06-09 MED ORDER — MENTHOL 3 MG MT LOZG: 1.0000 | LOZENGE | OROMUCOSAL | Status: DC | PRN

## 2015-06-09 MED ORDER — WITCH HAZEL-GLYCERIN EX PADS: 1.0000 "application " | MEDICATED_PAD | CUTANEOUS | Status: DC | PRN

## 2015-06-09 MED ORDER — NALBUPHINE HCL 10 MG/ML IJ SOLN: 5.0000 mg | Freq: Once | INTRAMUSCULAR | Status: AC | PRN

## 2015-06-09 MED ORDER — IBUPROFEN 600 MG PO TABS
600.0000 mg | ORAL_TABLET | Freq: Four times a day (QID) | ORAL | Status: DC | PRN
  Administered 2015-06-11: 600 mg via ORAL

## 2015-06-09 MED ORDER — OXYTOCIN 40 UNITS IN LACTATED RINGERS INFUSION - SIMPLE MED: 62.5000 mL/h | INTRAVENOUS | Status: AC

## 2015-06-09 MED ORDER — TETANUS-DIPHTH-ACELL PERTUSSIS 5-2.5-18.5 LF-MCG/0.5 IM SUSP
0.5000 mL | Freq: Once | INTRAMUSCULAR | Status: AC
  Administered 2015-06-10: 0.5 mL via INTRAMUSCULAR
  Filled 2015-06-09: qty 0.5

## 2015-06-09 MED ORDER — ACETAMINOPHEN 500 MG PO TABS
1000.0000 mg | ORAL_TABLET | Freq: Four times a day (QID) | ORAL | Status: AC
  Administered 2015-06-09: 1000 mg via ORAL
  Filled 2015-06-09: qty 2

## 2015-06-09 MED ORDER — OXYCODONE-ACETAMINOPHEN 5-325 MG PO TABS: 1.0000 | ORAL_TABLET | ORAL | Status: DC | PRN

## 2015-06-09 MED ORDER — SENNOSIDES-DOCUSATE SODIUM 8.6-50 MG PO TABS
2.0000 | ORAL_TABLET | ORAL | Status: DC
  Administered 2015-06-10 (×2): 2 via ORAL
  Filled 2015-06-09 (×3): qty 2

## 2015-06-09 MED ORDER — SCOPOLAMINE 1 MG/3DAYS TD PT72
1.0000 | MEDICATED_PATCH | Freq: Once | TRANSDERMAL | Status: DC
  Filled 2015-06-09: qty 1

## 2015-06-09 MED ORDER — DIPHENHYDRAMINE HCL 25 MG PO CAPS
25.0000 mg | ORAL_CAPSULE | ORAL | Status: DC | PRN
Start: 2015-06-09 — End: 2015-06-11
  Administered 2015-06-09 – 2015-06-10 (×4): 25 mg via ORAL
  Filled 2015-06-09 (×4): qty 1

## 2015-06-09 MED ORDER — DIBUCAINE 1 % RE OINT: 1.0000 "application " | TOPICAL_OINTMENT | RECTAL | Status: DC | PRN

## 2015-06-09 MED ORDER — NALOXONE HCL 0.4 MG/ML IJ SOLN: 0.4000 mg | INTRAMUSCULAR | Status: DC | PRN

## 2015-06-09 MED ORDER — NALBUPHINE HCL 10 MG/ML IJ SOLN: 5.0000 mg | INTRAMUSCULAR | Status: DC | PRN

## 2015-06-09 MED ORDER — ACETAMINOPHEN 325 MG PO TABS: 650.0000 mg | ORAL_TABLET | ORAL | Status: DC | PRN

## 2015-06-09 MED ORDER — NALBUPHINE HCL 10 MG/ML IJ SOLN
5.0000 mg | Freq: Once | INTRAMUSCULAR | Status: AC | PRN
  Administered 2015-06-09: 5 mg via INTRAVENOUS

## 2015-06-09 MED ORDER — INFLUENZA VAC SPLIT QUAD 0.5 ML IM SUSY
0.5000 mL | PREFILLED_SYRINGE | INTRAMUSCULAR | Status: AC
  Administered 2015-06-10: 0.5 mL via INTRAMUSCULAR
  Filled 2015-06-09: qty 0.5

## 2015-06-09 MED ORDER — ONDANSETRON HCL 4 MG/2ML IJ SOLN: 4.0000 mg | Freq: Three times a day (TID) | INTRAMUSCULAR | Status: DC | PRN

## 2015-06-09 MED ORDER — ZOLPIDEM TARTRATE 5 MG PO TABS: 5.0000 mg | ORAL_TABLET | Freq: Every evening | ORAL | Status: DC | PRN

## 2015-06-09 NOTE — Progress Notes (Signed)
Patient ID: Anna Calhoun, female   DOB: 06/04/1981, 34 y.o.   MRN: [id number] Postop day 1 BP over blood pressure 122/66 Pulse 60 respiration 18 afebrile Fundus firm Dressing dry Legs negative doing well

## 2015-06-09 NOTE — Progress Notes (Signed)
Pt requesting to not get up for orthostatics at this time d/t having pain.

## 2015-06-09 NOTE — Lactation Note (Signed)
This note was copied from the chart of Anna Shahidah Vandalen. Lactation Consultation Note Experienced BF mom for 1 month for the last 2 children of her 6. Mom plans to pump and breast/bottle feed this baby. Supplementing information sheet given how much formula to give. Baby is SGA. Mom encouraged to feed baby 8-12 times/24 hours and with feeding cues. Mom shown how to use DEBP & how to disassemble, clean, & reassemble parts. Mom knows to pump q3h for 15-20 min. Mom encouraged to do skin-to-skin. Reviewed Baby & Me book's Breastfeeding Basics. Mom encouraged to waken baby for feeds.  Educated about newborn behavior. [facility] brochure given w/resources, support groups and LC services. Patient Name: Anna Calhoun ZOXWR'UToday's Date: 06/09/2015 Reason for consult: Initial assessment   Maternal Data Has patient been taught Hand Expression?: Yes Does the patient have breastfeeding experience prior to this delivery?: Yes  Feeding Feeding Type: Formula Nipple Type: Slow - flow Length of feed: 0 min  LATCH Score/Interventions                      Lactation Tools Discussed/Used WIC Program: Yes Pump Review: Setup, frequency, and cleaning;Milk Storage Initiated by:: Peri JeffersonL. Nicle Connole RN Date initiated:: 06/09/15   Consult Status Consult Status: Follow-up Date: 06/10/15 Follow-up type: In-patient    Charyl DancerCARVER, Jalilah Wiltsie G 06/09/2015, 6:57 AM

## 2015-06-09 NOTE — Clinical Social Work Maternal (Signed)
CLINICAL SOCIAL WORK MATERNAL/CHILD NOTE  Patient Details  Name: Anna Calhoun MRN: [id number] Date of Birth: 1981/04/12  Date:  06/09/2015  Clinical Social Worker Initiating Note:  Lucita Ferrara MSW, LCSW Date/ Time Initiated:  06/09/15/1200    Child's Name:  Steva Ready   Legal Guardian:  Jamesetta Geralds and Luciano Cutter  Need for Interpreter:  None   Date of Referral:  06/08/15     Reason for Referral:  Current Substance Use/Substance Use During Pregnancy    Referral Source:  [facility]   Address:  [address] [place], [place] [address]  Phone number:  [phone]   Household Members:  Minor Children, Significant Other, godmother  Natural Supports (not living in the home):  Immediate Family, Extended Family   Professional Supports: Other (Comment)   Employment: Unemployed   Type of Work:     Education:      Pensions consultant:  [employer]   Other Resources:  Physicist, medical , [place] Considerations Which May Impact Care:  None reported  Strengths:  Ability to meet basic needs , Home prepared for child    Risk Factors/Current Problems:   1. 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  MOB reported that she is currently in treatment, but she was unable to recall name of provider. Infant's UDS and cord tissue results pending.    Cognitive State:  Able to Concentrate , Alert , Goal Oriented , Linear Thinking    Mood/Affect:  Happy , Bright    CSW Assessment:  CSW received request for consult due to MOB presenting with history of cocaine use early in pregnancy. MOB provided consent for CSW to complete assessment in presence of her sister who was sleeping.  MOB presented in a pleasant mood, displayed a full range in affect, and was providing skin to skin with the infant. MOB expressed feelings of happiness secondary to the infant's birth, and was kissing the infant during the assessment.  MOB denied questions or  concerns as she prepares to transition home, and reported that she has "good" support. Per MOB, she lives with her godmother, her two youngest children, the FOB, and now this infant.  MOB confirmed that her home is prepared for the infant, and that all basic needs are met. Per MOB, her oldest three children live in [place] with their father.  MOB denied history of perinatal mood disorders, and denied mental health complications during the pregnancy.  MOB denied any barriers to accessing prenatal appointments and medical care, and became more frustrated/agitated when CSW inquired about prenatal appointments.  MOB stated that she was able to re-schedules all missed appointments, and only missed final two appointments.    When CSW inquired about substance use, MOB stated that she was informed that she had a +UDS in May and July for cocaine. MOB stated that once she learned that she was pregnant she did not engage in any additional use.  MOB reported that she is currently participating in substance use treatment, and shared that a psychiatrist "Dr. Kalman Shan" comes into her neighborhood and community for services. MOB stated that she is participating in groups, and has individual sessions; however, she was unable to recall name of the agency.  MOB reported that she feels well supported, and stated that the agency has also helped her to prepare for the home.  Per MOB, they will come to the hospital prior to discharge in order to visit with her  and the infant. MOB denied belief that substance use is a current program, and expressed intention to continue with current program.   MOB verbalized understanding of the hospital drug screen policy, and denied questions or concerns. MOB is aware that a report to CPS will be made if the infant has a positive drug screen.  MOB shared that she knows what to anticipate and expect with CPS, and denied any questions or concerns.   MOB acknowledged ongoing CSW availability, and  agreed to contact CSW if needs arise.    CSW Plan/Description:   1. Patient/Family Education: Perinatal mood disorders, hospital drug screen policy  2. CSW to monitor infant's toxicology screens, and will refer to CPS if positive.   3. No Further Intervention Required/No Barriers to Discharge    Sharyl Nimrod 06/09/2015, 12:34 PM

## 2015-06-09 NOTE — Addendum Note (Signed)
Addendum  created 06/09/15 0817 by Junious SilkMelinda Merilyn Pagan, CRNA   Modules edited: Clinical Notes   Clinical Notes:  File: [id number]

## 2015-06-09 NOTE — Anesthesia Postprocedure Evaluation (Signed)
Anesthesia Post Note  Patient: Anna Calhoun  Procedure(s) Performed: Procedure(s) (LRB): CESAREAN SECTION (N/A)  Patient location during evaluation: Mother Baby Anesthesia Type: Spinal Level of consciousness: awake and alert and oriented Pain management: pain level controlled Vital Signs Assessment: post-procedure vital signs reviewed and stable Respiratory status: spontaneous breathing and respiratory function stable Cardiovascular status: blood pressure returned to baseline Postop Assessment: no headache, no backache, patient able to bend at knees, no signs of nausea or vomiting and adequate PO intake Anesthetic complications: no    Last Vitals:  Filed Vitals:   06/09/15 0312 06/09/15 0413  BP: 111/60 112/66  Pulse: 59 63  Temp: 36.4 C 36.4 C  Resp: 18 18    Last Pain:  Filed Vitals:   06/09/15 0557  PainSc: 7                  Kayle Passarelli

## 2015-06-10 MED ORDER — BISACODYL 10 MG RE SUPP
10.0000 mg | Freq: Once | RECTAL | Status: AC
  Administered 2015-06-10: 10 mg via RECTAL
  Filled 2015-06-10: qty 1

## 2015-06-10 NOTE — Progress Notes (Signed)
Patient ID: Anna Calhoun, female   DOB: 07/09/1980, 34 y.o.   MRN: [id number] Postop day 2 Blood pressure 112/58 pulse 60 history respiration 18 Fundus firm Dressing dry Legs negative doing well

## 2015-06-11 ENCOUNTER — Inpatient Hospital Stay (HOSPITAL_COMMUNITY): Admission: RE | Admit: 2015-06-11 | Payer: Medicaid Other | Source: Ambulatory Visit

## 2015-06-11 NOTE — Discharge Summary (Signed)
Obstetric Discharge Summary Reason for Admission: cesarean section Prenatal Procedures: none Intrapartum Procedures: cesarean: low cervical, transverse Postpartum Procedures: none Complications-Operative and Postpartum: none HEMOGLOBIN  Date Value Ref Range Status  06/09/2015 9.1* 12.0 - 15.0 g/dL Final   HCT  Date Value Ref Range Status  06/09/2015 28.2* 36.0 - 46.0 % Final    Physical Exam:  General: alert Lochia: appropriate Uterine Fundus: firm Incision: healing well DVT Evaluation: No evidence of DVT seen on physical exam.  Discharge Diagnoses: Term Pregnancy-delivered  Discharge Information: Date: 06/11/2015 Activity: pelvic rest Diet: routine Medications: Percocet Condition: improved Instructions: refer to practice specific booklet Discharge to: home Follow-up Information    Follow up with Kathreen CosierMARSHALL,Tiajuana Leppanen A, MD.   Specialty:  Obstetrics and Gynecology   Contact information:   [address] [place] [address] [phone]       Newborn Data: Live born female  Birth Weight: 5 lb 14.9 oz (2690 g) APGAR: 9, 9  Home with mother.  Kenon Delashmit A 06/11/2015, 6:38 AM

## 2015-06-11 NOTE — Progress Notes (Signed)
Patient ID: Anna Calhoun, female   DOB: 01/15/1981, 34 y.o.   MRN: [id number] Postop day 3 Blood pressure 107/53 Time respiration 20 pulse 88 afebrile Abdomen soft dressing dry incision clean legs negative home today

## 2015-06-11 NOTE — Lactation Note (Signed)
This note was copied from the chart of Anna Vidya Poplaski. Lactation Consultation Note  Patient Name: Anna Calhoun ZOXWR'UToday's Date: 06/11/2015 Reason for consult: Follow-up assessment Baby 61 hours old. Mom giving bottle of formula when this LC entered room. Mom states she was pumping earlier to give baby colostrum, but from here forward she will be giving formula.  Maternal Data    Feeding Feeding Type: Bottle Fed - Formula Nipple Type: Slow - flow  LATCH Score/Interventions                      Lactation Tools Discussed/Used     Consult Status Consult Status: PRN    Geralynn OchsWILLIARD, Alberta Cairns 06/11/2015, 11:15 AM

## 2015-06-11 NOTE — Progress Notes (Signed)
At 2010 when changing saturated (old drainage) honeycomb, noted 1-in section of protruding tissue between staples on right side.  Notified Dr Clearance CootsHarper (on call).  He indicated to replace with new Honeycomb and Dr Gaynell FaceMarshall will  Assess in morning. JtWells, RN

## 2015-06-11 NOTE — Discharge Instructions (Signed)
Discharge instructions ° °· You can wash your hair °· Shower °· Eat what you want °· Drink what you want °· See me in 6 weeks °· Your ankles are going to swell more in the next 2 weeks than when pregnant °· No sex for 6 weeks ° ° °Alaine Loughney A, MD 06/11/2015 ° ° °

## 2015-06-14 ENCOUNTER — Encounter (HOSPITAL_COMMUNITY): Admission: RE | Payer: Self-pay | Source: Ambulatory Visit

## 2015-06-14 ENCOUNTER — Inpatient Hospital Stay (HOSPITAL_COMMUNITY): Admission: RE | Admit: 2015-06-14 | Payer: Medicaid Other | Source: Ambulatory Visit | Admitting: Obstetrics

## 2015-06-14 SURGERY — Surgical Case
Anesthesia: Regional

## 2015-06-25 ENCOUNTER — Encounter (HOSPITAL_COMMUNITY): Payer: Self-pay | Admitting: *Deleted

## 2015-06-25 ENCOUNTER — Inpatient Hospital Stay (HOSPITAL_COMMUNITY)
Admission: AD | Admit: 2015-06-25 | Discharge: 2015-06-25 | Disposition: A | Discharge status: Home / Self Care | Payer: Medicaid Other | Source: Ambulatory Visit | Attending: Obstetrics | Admitting: Obstetrics

## 2015-06-25 DIAGNOSIS — Z5320 Procedure and treatment not carried out because of patient's decision for unspecified reasons: Secondary | ICD-10-CM | POA: Diagnosis not present

## 2015-06-25 DIAGNOSIS — O860 Infection of obstetric surgical wound: Secondary | ICD-10-CM

## 2015-06-25 DIAGNOSIS — T814XXA Infection following a procedure, initial encounter: Secondary | ICD-10-CM

## 2015-06-25 DIAGNOSIS — O9089 Other complications of the puerperium, not elsewhere classified: Secondary | ICD-10-CM | POA: Diagnosis present

## 2015-06-25 DIAGNOSIS — IMO0001 Reserved for inherently not codable concepts without codable children: Secondary | ICD-10-CM

## 2015-06-25 LAB — CBC WITH DIFFERENTIAL/PLATELET
Basophils Absolute: 0 10*3/uL (ref 0.0–0.1)
Basophils Relative: 0 %
EOS PCT: 1 %
Eosinophils Absolute: 0.1 10*3/uL (ref 0.0–0.7)
HCT: 38.4 % (ref 36.0–46.0)
Hemoglobin: 12.6 g/dL (ref 12.0–15.0)
LYMPHS ABS: 3.9 10*3/uL (ref 0.7–4.0)
Lymphocytes Relative: 48 %
MCH: 27.5 pg (ref 26.0–34.0)
MCHC: 32.8 g/dL (ref 30.0–36.0)
MCV: 83.8 fL (ref 78.0–100.0)
MONOS PCT: 4 %
Monocytes Absolute: 0.3 10*3/uL (ref 0.1–1.0)
Neutro Abs: 3.8 10*3/uL (ref 1.7–7.7)
Neutrophils Relative %: 47 %
PLATELETS: 463 10*3/uL — AB (ref 150–400)
RBC: 4.58 MIL/uL (ref 3.87–5.11)
RDW: 14.4 % (ref 11.5–15.5)
WBC: 8.1 10*3/uL (ref 4.0–10.5)

## 2015-06-25 MED ORDER — OXYCODONE-ACETAMINOPHEN 5-325 MG PO TABS: 1.0000 | ORAL_TABLET | Freq: Four times a day (QID) | ORAL | Status: DC | PRN

## 2015-06-25 MED ORDER — CLINDAMYCIN HCL 300 MG PO CAPS: 300.0000 mg | ORAL_CAPSULE | Freq: Four times a day (QID) | ORAL | Status: DC

## 2015-06-25 MED ORDER — OXYCODONE-ACETAMINOPHEN 5-325 MG PO TABS
2.0000 | ORAL_TABLET | Freq: Once | ORAL | Status: AC
  Administered 2015-06-25: 2 via ORAL
  Filled 2015-06-25: qty 2

## 2015-06-25 MED ORDER — IBUPROFEN 600 MG PO TABS: 600.0000 mg | ORAL_TABLET | Freq: Four times a day (QID) | ORAL | Status: DC | PRN

## 2015-06-25 MED ORDER — CLINDAMYCIN HCL 300 MG PO CAPS
300.0000 mg | ORAL_CAPSULE | Freq: Once | ORAL | Status: AC
  Administered 2015-06-25: 300 mg via ORAL
  Filled 2015-06-25: qty 1

## 2015-06-25 NOTE — MAU Provider Note (Signed)
Chief Complaint: Post-op Problem  First Provider Initiated Contact with Patient 06/25/15 2122    SUBJECTIVE HPI: Anna Calhoun is a 35 y.o. [id number] at 17 days S/P RLTCS 06/08/15 who presents to [facility] reporting worsening of pain, opening and foul-smelling drainage from right and of her incision. States she was evaluated by Dr. Gaynell FaceMarshall last week. No RX ABX.    Location: right end of incision Quality: sore, burning Severity: 8/10 on pain scale Duration: 1 week Context: None Timing: Constant Modifying factors: Had improved w/ percocet and ibuprofen, but they are no longer helping Associated signs and symptoms: Pos for incision, pain, drainage, slight separation of incision and worsening odor. Neg for fever, chills, bleeding from incision.  Past Medical History  Diagnosis Date  . Gonorrhea 1997  . Chlamydia 1997  . Medical history non-contributory   . Herpes    OB History  Gravida Para Term Preterm AB SAB TAB Ectopic Multiple Living  6 6 5 1  0    0 6    # Outcome Date GA Lbr Len/2nd Weight Sex Delivery Anes PTL Lv  6 Term 06/08/15 8423w4d  5 lb 14.9 oz (2.69 kg) F CS-LTranv Spinal  Y  5 Term 02/04/13 4160w3d  7 lb 12.5 oz (3.53 kg) M CS-LTranv EPI  Y  4 Term 2010 1622w0d  7 lb 11 oz (3.487 kg) [place]and[place]e FerdinandM CS-LTranv   Y  3 Term 2003 2222w0d  5 lb 12 oz (2.608 kg) M Vag-Spont   Y  2 Preterm 2000 6370w0d  3 lb 12 oz (1.701 kg) M Vag-Spont   Y  1 Term 1997 1622w0d  5 lb 13 oz (2.637 kg) F Vag-Spont   Y     Past Surgical History  Procedure Laterality Date  . Cesarean section    . No past surgeries    . Cesarean section N/A 02/04/2013    Procedure: CESAREAN SECTION;  Surgeon: Kathreen CosierBernard A Marshall, MD;  Location: [facility];  Service: Obstetrics;  Laterality: N/A;  . Cesarean section N/A 06/08/2015    Procedure: CESAREAN SECTION;  Surgeon: Kathreen CosierBernard A Marshall, MD;  Location: [facility];  Service: Obstetrics;  Laterality: N/A;   Social History   Social History  . Marital Status: Single    Spouse  Name: N/A  . Number of Children: N/A  . Years of Education: N/A   Occupational History  . Not on file.   Social History Main Topics  . Smoking status: Former Games developermoker  . Smokeless tobacco: Never Used  . Alcohol Use: Yes     Comment: varies not since pregnancy  . Drug Use: Yes    Special: Cocaine     Comment: pt has quit using cocaine  . Sexual Activity: Yes   Other Topics Concern  . Not on file   Social History Narrative   No current facility-administered medications on file prior to encounter.   No current outpatient prescriptions on file prior to encounter.   No Known Allergies  I have reviewed the past Medical Hx, Surgical Hx, Social Hx, Allergies and Medications.   Review of Systems  Constitutional: Negative for fever and chills.  Gastrointestinal: Positive for abdominal pain (incision only).  Musculoskeletal: Negative for myalgias.  Skin:       Incision pain, drainage and odor.    OBJECTIVE Patient Vitals for the past 24 hrs:  BP Temp Pulse Resp Height Weight  06/25/15 2300 149/86 mmHg 97.9 F (36.6 C) 107 20 - -  06/25/15 2014 116/75 mmHg  98.6 F (37 C) 112 20 5' (1.524 m) 153 lb (69.4 kg)   Constitutional: Well-developed, well-nourished female in no acute distress at rest, but moderate distress w/ mvmt.  Cardiovascular: mild tachycardia Respiratory: normal rate and effort.  GI: Abd soft, non-tender.  Skin: incision well approximated except for slight opening, tenderness, erythema and swelling at right end of incision. Scant purulent drainage, foul odor. Neurologic: Alert and oriented x 4.   LAB RESULTS Results for orders placed or performed during the hospital encounter of 06/25/15 (from the past 24 hour(s))  CBC with Differential/Platelet     Status: Abnormal   Collection Time: 06/25/15  8:35 PM  Result Value Ref Range   WBC 8.1 4.0 - 10.5 K/uL   RBC 4.58 3.87 - 5.11 MIL/uL   Hemoglobin 12.6 12.0 - 15.0 g/dL   HCT 96.0 45.4 - 09.8 %   MCV 83.8 78.0  - 100.0 fL   MCH 27.5 26.0 - 34.0 pg   MCHC 32.8 30.0 - 36.0 g/dL   RDW 11.9 14.7 - 82.9 %   Platelets 463 (H) 150 - 400 K/uL   Neutrophils Relative % 47 %   Neutro Abs 3.8 1.7 - 7.7 K/uL   Lymphocytes Relative 48 %   Lymphs Abs 3.9 0.7 - 4.0 K/uL   Monocytes Relative 4 %   Monocytes Absolute 0.3 0.1 - 1.0 K/uL   Eosinophils Relative 1 %   Eosinophils Absolute 0.1 0.0 - 0.7 K/uL   Basophils Relative 0 %   Basophils Absolute 0.0 0.0 - 0.1 K/uL    IMAGING No results found.  MAU COURSE/MDM CBC  Discussed Hx exam, labs w/ Dr Gaynell Face. Will start Clinda for cellulitis. Low suspicion for abscess or other emergent condition.   ASSESSMENT 1. Incisional infection, initial encounter     PLAN Discharge home in stable condition per consult w/ Dr. Gaynell Face. Infection Precautions     Follow-up Information    Follow up with Kathreen Cosier, MD On 06/29/2015.   Specialty:  Obstetrics and Gynecology   Why:  at 10:00 am for incision check   Contact information:   [address] [place] [address] [phone]       Follow up with [facility] [place] [facility].   Why:  As needed in emergencies (fever greater than 100.4, worsening of pain, redness or swelling)   Contact information:   [address] [id number] mc [place] [address] [phone]       Medication List    TAKE these medications        clindamycin 300 MG capsule  Commonly known as:  CLEOCIN  Take 1 capsule (300 mg total) by mouth 4 (four) times daily. For ten days     ibuprofen 600 MG tablet  Commonly known as:  ADVIL,MOTRIN  Take 1 tablet (600 mg total) by mouth every 6 (six) hours as needed for moderate pain.     magnesium hydroxide 400 MG/5ML suspension  Commonly known as:  MILK OF MAGNESIA  Take 15 mLs by mouth daily as needed for mild constipation.     oxyCODONE-acetaminophen 5-325 MG tablet  Commonly known as:  PERCOCET/ROXICET  Take  1-2 tablets by mouth every 6 (six) hours as needed for severe pain.         [place], CNM 06/25/2015  11:08 PM

## 2015-06-25 NOTE — MAU Note (Signed)
Pt called and not in lobby 

## 2015-06-25 NOTE — Discharge Instructions (Signed)
Wound Infection A wound infection happens when a type of germ (bacteria) starts growing in the wound. In some cases, this can cause the wound to break open. If cared for properly, the infected wound will heal from the inside to the outside. Wound infections need treatment. CAUSES An infection is caused by bacteria growing in the wound.  SYMPTOMS   Increase in redness, swelling, or pain at the wound site.  Increase in drainage at the wound site.  Wound or bandage (dressing) starts to smell bad.  Fever.  Feeling tired or fatigued.  Pus draining from the wound. TREATMENT  Your health care provider will prescribe antibiotic medicine. The wound infection should improve within 24 to 48 hours. Any redness around the wound should stop spreading and the wound should be less painful.  HOME CARE INSTRUCTIONS   Only take over-the-counter or prescription medicines for pain, discomfort, or fever as directed by your health care provider.  Take your antibiotics as directed. Finish them even if you start to feel better.  Gently wash the area with mild soap and water 2 times a day, or as directed. Rinse off the soap. Pat the area dry with a clean towel. Do not rub the wound. This may cause bleeding.  Apply warm compresses to incision 5 times per day  If the dressing sticks, moisten it with soapy water and gently remove it.  Take showers. Do not take tub baths, swim, or do anything that may soak the wound until it is healed.  Use anti-itch medicine as directed by your health care provider. The wound may itch when it is healing. Do not pick or scratch at the wound.  Follow up with your health care provider to get your wound rechecked as directed. SEEK MEDICAL CARE IF:  You have an increase in swelling, pain, or redness around the wound.  You have an increase in the amount of pus coming from the wound.  There is a bad smell coming from the wound.  More of the wound breaks open.  You have a  fever. MAKE SURE YOU:   Understand these instructions.  Will watch your condition.  Will get help right away if you are not doing well or get worse.   This information is not intended to replace advice given to you by your health care provider. Make sure you discuss any questions you have with your health care provider.   Document Released: 02/25/2003 Document Revised: 06/03/2013 Document Reviewed: 11/16/2014 Elsevier Interactive Patient Education [employer]2016 Elsevier Inc.

## 2015-06-25 NOTE — Progress Notes (Signed)
Pt d/c home by Isabel CapriceKathy Wilson RN

## 2015-06-25 NOTE — Progress Notes (Signed)
Ivonne AndrewV. Smith CNM in earlier to discuss d/c plan and meds with pt. Written and verbal d/c instructions given and understanding voiced

## 2015-06-25 NOTE — MAU Note (Addendum)
Had c/s 12/27. For several days R side of incision with odor, some drainage, and painful. Saw doctor last week but has been getting worse. This was 3rd C/s.( One edge of incision red on R side with some wetness to skin noted but no color to drainage that can detect on skin)

## 2016-10-11 ENCOUNTER — Encounter (HOSPITAL_COMMUNITY): Payer: Self-pay

## 2016-10-11 ENCOUNTER — Emergency Department (HOSPITAL_COMMUNITY)
Admission: EM | Admit: 2016-10-11 | Discharge: 2016-10-11 | Disposition: A | Discharge status: Home / Self Care | Payer: Medicaid Other | Attending: Emergency Medicine | Admitting: Emergency Medicine

## 2016-10-11 DIAGNOSIS — Z79899 Other long term (current) drug therapy: Secondary | ICD-10-CM | POA: Insufficient documentation

## 2016-10-11 DIAGNOSIS — J069 Acute upper respiratory infection, unspecified: Secondary | ICD-10-CM | POA: Insufficient documentation

## 2016-10-11 DIAGNOSIS — J029 Acute pharyngitis, unspecified: Secondary | ICD-10-CM

## 2016-10-11 DIAGNOSIS — F17200 Nicotine dependence, unspecified, uncomplicated: Secondary | ICD-10-CM | POA: Insufficient documentation

## 2016-10-11 LAB — RAPID STREP SCREEN (MED CTR MEBANE ONLY): STREPTOCOCCUS, GROUP A SCREEN (DIRECT): NEGATIVE

## 2016-10-11 NOTE — ED Provider Notes (Signed)
[facility]-EMERGENCY DEPT Provider Note   CSN: [id number] Arrival date & time: 10/11/16  1539     History   Chief Complaint Chief Complaint  Patient presents with  . Sore Throat  . Otalgia    HPI Anna Calhoun is a 36 y.o. female who presents to the ED with complaints of sore throat 4 days. She describes the pain as 10/10 constant burning and pinching posterior throat pain that radiates to the right ear, worse with swallowing and eating, and minimally improved with ibuprofen, unrelieved with cough drops. She reports that multiple family members have been sick over the last 2-3 weeks, including her son who had strep throat 1 month ago. She denies any fevers, chills, ear drainage, rhinorrhea, drooling, trismus, cough, CP, SOB, abd pain, N/V/D/C, hematuria, dysuria, myalgias, arthralgias, numbness, tingling, focal weakness, or any other complaints at this time.    The history is provided by the patient and medical records. No language interpreter was used.  Sore Throat  This is a new problem. The current episode started more than 2 days ago. The problem occurs constantly. The problem has not changed since onset.Pertinent negatives include no chest pain, no abdominal pain and no shortness of breath. The symptoms are aggravated by swallowing. The symptoms are relieved by NSAIDs. Treatments tried: cough drops and ibuprofen. The treatment provided mild relief.  Otalgia  Associated symptoms include sore throat. Pertinent negatives include no ear discharge, no rhinorrhea, no abdominal pain, no diarrhea, no vomiting and no cough.    Past Medical History:  Diagnosis Date  . Chlamydia 1997  . Gonorrhea 1997  . Herpes   . Medical history non-contributory     Patient Active Problem List   Diagnosis Date Noted  . S/P cesarean section 06/09/2015  . 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 CESAREAN SECTION    . CESAREAN SECTION N/A 02/04/2013   Procedure: CESAREAN SECTION;  Surgeon: Kathreen Cosier, MD;  Location: [facility];  Service: Obstetrics;  Laterality: N/A;  . CESAREAN SECTION N/A 06/08/2015   Procedure: CESAREAN SECTION;  Surgeon: Kathreen Cosier, MD;  Location: [facility];  Service: Obstetrics;  Laterality: N/A;  . NO PAST SURGERIES      OB History    Gravida Para Term Preterm AB Living   0 6   SAB TAB Ectopic Multiple Live Births         0 6       Home Medications    Prior to Admission medications   Medication Sig Start Date End Date Taking? Authorizing Provider  clindamycin (CLEOCIN) 300 MG capsule Take 1 capsule (300 mg total) by mouth 4 (four) times daily. For ten days 06/25/15   Dorathy Kinsman, CNM  ibuprofen (ADVIL,MOTRIN) 600 MG tablet Take 1 tablet (600 mg total) by mouth every 6 (six) hours as needed for moderate pain. 06/25/15   Dorathy Kinsman, CNM  magnesium hydroxide (MILK OF MAGNESIA) 400 MG/5ML suspension Take 15 mLs by mouth daily as needed for mild constipation.    Historical Provider, MD  oxyCODONE-acetaminophen (PERCOCET/ROXICET) 5-325 MG tablet Take 1-2 tablets by mouth every 6 (six) hours as needed for severe pain. 06/25/15   Dorathy Kinsman, CNM    Family History Family History  Problem Relation Age of Onset  . Hypertension Mother   . Diabetes Father   . Cancer Maternal Grandfather   .  Cancer Paternal Grandmother     Social History Social History  Substance Use Topics  . Smoking status: Current Every Day Smoker    Packs/day: 0.15  . Smokeless tobacco: Never Used  . Alcohol use Yes     Allergies   Patient has no known allergies.   Review of Systems Review of Systems  Constitutional: Negative for chills and fever.  HENT: Positive for ear pain and sore throat. Negative for drooling, ear discharge, rhinorrhea and trouble swallowing.   Respiratory: Negative for cough and shortness of breath.     Cardiovascular: Negative for chest pain.  Gastrointestinal: Negative for abdominal pain, constipation, diarrhea, nausea and vomiting.  Genitourinary: Negative for dysuria and hematuria.  Musculoskeletal: Negative for arthralgias and myalgias.  Skin: Negative for color change.  Allergic/Immunologic: Negative for immunocompromised state.  Neurological: Negative for weakness and numbness.  Psychiatric/Behavioral: Negative for confusion.   All other systems reviewed and are negative for acute change except as noted in the HPI.    Physical Exam Updated Vital Signs BP 115/76 (BP Location: Left Arm)   Pulse 94   Temp 98.2 F (36.8 C) (Oral)   Resp 18   Ht 5' (1.524 m)   Wt 78.9 kg   LMP 09/24/2016   SpO2 99%   BMI 33.98 kg/m   Physical Exam  Constitutional: She is oriented to person, place, and time. Vital signs are normal. She appears well-developed and well-nourished.  Non-toxic appearance. No distress.  Afebrile, nontoxic, NAD  HENT:  Head: Normocephalic and atraumatic.  Right Ear: Hearing, tympanic membrane, external ear and ear canal normal.  Left Ear: Hearing, tympanic membrane, external ear and ear canal normal.  Nose: Nose normal.  Mouth/Throat: Uvula is midline and mucous membranes are normal. No trismus in the jaw. No uvula swelling. Posterior oropharyngeal erythema present. No oropharyngeal exudate, posterior oropharyngeal edema or tonsillar abscesses. Tonsils are 0 on the right. Tonsils are 0 on the left. No tonsillar exudate.  Ears are clear bilaterally. Nose clear. Oropharynx mildly injected, without uvular swelling or deviation, no trismus or drooling, no tonsillar swelling, no exudates. No PTA. Handling secretions well.   Eyes: Conjunctivae and EOM are normal. Right eye exhibits no discharge. Left eye exhibits no discharge.  Neck: Normal range of motion. Neck supple.  Cardiovascular: Normal rate and intact distal pulses.   Pulmonary/Chest: Effort normal. No  respiratory distress.  Abdominal: Normal appearance. She exhibits no distension.  Musculoskeletal: Normal range of motion.  Lymphadenopathy:    She has cervical adenopathy.  Shotty cervical LAD bilaterally which is mildly TTP  Neurological: She is alert and oriented to person, place, and time. She has normal strength. No sensory deficit.  Skin: Skin is warm, dry and intact. No rash noted.  Psychiatric: She has a normal mood and affect. Her behavior is normal.  Nursing note and vitals reviewed.    ED Treatments / Results  Labs (all labs ordered are listed, but only abnormal results are displayed) Labs Reviewed  RAPID STREP SCREEN (NOT AT [facility])  CULTURE, GROUP A STREP [facility])    EKG  EKG Interpretation None       Radiology No results found.  Procedures Procedures (including critical care time)  Medications Ordered in ED Medications - No data to display   Initial Impression / Assessment and Plan / ED Course  I have reviewed the triage vital signs and the nursing notes.  Pertinent labs & imaging results that were available during my care of the patient were  reviewed by me and considered in my medical decision making (see chart for details).     36 y.o. female here with sore throat x4 days that radiates to R ear. On exam, ears clear, throat mildly injected but no tonsillar swelling or exudates. Handling secretions well, no PTA or evidence of ludwig's. RST neg, and clinically she doesn't appear to have strep. Likely viral illness. Advised symptomatic relief with OTC remedies. F/up with PCP in 1wk for recheck. I explained the diagnosis and have given explicit precautions to return to the ER including for any other new or worsening symptoms. The patient understands and accepts the medical plan as it's been dictated and I have answered their questions. Discharge instructions concerning home care and prescriptions have been given. The patient is STABLE and is discharged to home in  good condition.    Final Clinical Impressions(s) / ED Diagnoses   Final diagnoses:  Sore throat    New Prescriptions New Prescriptions   No medications on file     [address] PA-C 10/11/16 1707    Doug Sou, MD 10/11/16 2358

## 2016-10-11 NOTE — ED Triage Notes (Addendum)
Patient c/o sore throat and right ear pain x 2 days. Patient states she has increased pain with swallowing. Patient is able to control saliva. Patient denies having a fever.

## 2016-10-11 NOTE — Discharge Instructions (Signed)
Continue to stay well-hydrated. Gargle warm salt water and spit it out. Use chloraseptic spray as needed for sore throat. Continue to alternate between Tylenol and Ibuprofen for pain or fever. Use Mucinex for cough suppression/expectoration of mucus. Use netipot and flonase to help with nasal congestion. May consider over-the-counter Benadryl or other antihistamine to decrease secretions and for help with your symptoms. Followup with your primary care doctor in 5-7 days for recheck of ongoing symptoms. Return to emergency department for emergent changing or worsening of symptoms.  °

## 2016-10-14 LAB — CULTURE, GROUP A STREP (THRC)

## 2017-07-12 ENCOUNTER — Emergency Department (HOSPITAL_COMMUNITY): Payer: Medicaid Other

## 2017-07-12 ENCOUNTER — Encounter (HOSPITAL_COMMUNITY): Payer: Self-pay | Admitting: Emergency Medicine

## 2017-07-12 ENCOUNTER — Emergency Department (HOSPITAL_COMMUNITY)
Admission: EM | Admit: 2017-07-12 | Discharge: 2017-07-12 | Disposition: A | Discharge status: Home / Self Care | Payer: Medicaid Other | Attending: Emergency Medicine | Admitting: Emergency Medicine

## 2017-07-12 DIAGNOSIS — Z79899 Other long term (current) drug therapy: Secondary | ICD-10-CM | POA: Diagnosis not present

## 2017-07-12 DIAGNOSIS — M545 Low back pain, unspecified: Secondary | ICD-10-CM

## 2017-07-12 DIAGNOSIS — F17210 Nicotine dependence, cigarettes, uncomplicated: Secondary | ICD-10-CM | POA: Diagnosis not present

## 2017-07-12 DIAGNOSIS — R109 Unspecified abdominal pain: Secondary | ICD-10-CM | POA: Diagnosis present

## 2017-07-12 LAB — URINALYSIS, ROUTINE W REFLEX MICROSCOPIC
Bilirubin Urine: NEGATIVE
GLUCOSE, UA: NEGATIVE mg/dL
Ketones, ur: NEGATIVE mg/dL
Leukocytes, UA: NEGATIVE
NITRITE: NEGATIVE
Protein, ur: NEGATIVE mg/dL
SPECIFIC GRAVITY, URINE: 1.024 (ref 1.005–1.030)
pH: 7 (ref 5.0–8.0)

## 2017-07-12 LAB — HEPATIC FUNCTION PANEL
ALT: 17 U/L (ref 14–54)
AST: 23 U/L (ref 15–41)
Albumin: 3.9 g/dL (ref 3.5–5.0)
Alkaline Phosphatase: 37 U/L — ABNORMAL LOW (ref 38–126)
BILIRUBIN TOTAL: 0.5 mg/dL (ref 0.3–1.2)
Total Protein: 7 g/dL (ref 6.5–8.1)

## 2017-07-12 LAB — I-STAT CHEM 8, ED
BUN: 9 mg/dL (ref 6–20)
CALCIUM ION: 1.03 mmol/L — AB (ref 1.15–1.40)
CREATININE: 0.4 mg/dL — AB (ref 0.44–1.00)
Chloride: 110 mmol/L (ref 101–111)
Glucose, Bld: 82 mg/dL (ref 65–99)
HCT: 34 % — ABNORMAL LOW (ref 36.0–46.0)
HEMOGLOBIN: 11.6 g/dL — AB (ref 12.0–15.0)
Potassium: 3.5 mmol/L (ref 3.5–5.1)
Sodium: 141 mmol/L (ref 135–145)
TCO2: 19 mmol/L — ABNORMAL LOW (ref 22–32)

## 2017-07-12 LAB — CBC WITH DIFFERENTIAL/PLATELET
BASOS ABS: 0 10*3/uL (ref 0.0–0.1)
Basophils Relative: 0 %
Eosinophils Absolute: 0.1 10*3/uL (ref 0.0–0.7)
Eosinophils Relative: 2 %
HEMATOCRIT: 36.5 % (ref 36.0–46.0)
Hemoglobin: 11.7 g/dL — ABNORMAL LOW (ref 12.0–15.0)
Lymphocytes Relative: 45 %
Lymphs Abs: 2.4 10*3/uL (ref 0.7–4.0)
MCH: 28.1 pg (ref 26.0–34.0)
MCHC: 32.1 g/dL (ref 30.0–36.0)
MCV: 87.5 fL (ref 78.0–100.0)
MONO ABS: 0.2 10*3/uL (ref 0.1–1.0)
Monocytes Relative: 3 %
NEUTROS ABS: 2.7 10*3/uL (ref 1.7–7.7)
Neutrophils Relative %: 50 %
PLATELETS: 249 10*3/uL (ref 150–400)
RBC: 4.17 MIL/uL (ref 3.87–5.11)
RDW: 13.6 % (ref 11.5–15.5)
WBC: 5.3 10*3/uL (ref 4.0–10.5)

## 2017-07-12 LAB — LIPASE, BLOOD: Lipase: 31 U/L (ref 11–51)

## 2017-07-12 LAB — PREGNANCY, URINE: Preg Test, Ur: NEGATIVE

## 2017-07-12 MED ORDER — ONDANSETRON HCL 4 MG/2ML IJ SOLN
4.0000 mg | Freq: Once | INTRAMUSCULAR | Status: AC
  Administered 2017-07-12: 4 mg via INTRAVENOUS
  Filled 2017-07-12: qty 2

## 2017-07-12 MED ORDER — CYCLOBENZAPRINE HCL 10 MG PO TABS: 10.0000 mg | ORAL_TABLET | Freq: Two times a day (BID) | ORAL | 0 refills | Status: DC | PRN

## 2017-07-12 MED ORDER — KETOROLAC TROMETHAMINE 30 MG/ML IJ SOLN
30.0000 mg | Freq: Once | INTRAMUSCULAR | Status: AC
  Administered 2017-07-12: 30 mg via INTRAVENOUS
  Filled 2017-07-12: qty 1

## 2017-07-12 MED ORDER — MORPHINE SULFATE (PF) 4 MG/ML IV SOLN
4.0000 mg | Freq: Once | INTRAVENOUS | Status: AC
  Administered 2017-07-12: 4 mg via INTRAVENOUS
  Filled 2017-07-12: qty 1

## 2017-07-12 MED ORDER — IBUPROFEN 600 MG PO TABS: 600.0000 mg | ORAL_TABLET | Freq: Four times a day (QID) | ORAL | 0 refills | Status: DC | PRN

## 2017-07-12 NOTE — ED Provider Notes (Signed)
[facility] EMERGENCY DEPARTMENT Provider Note   CSN: [id number] Arrival date & time: 07/12/17  1422     History   Chief Complaint Chief Complaint  Patient presents with  . Back Pain    HPI Anna Calhoun is a 37 y.o. female.  HPI   37 year old female brought here via EMS from home for evaluation of back pain.  Patient report acute onset of left flank pain started approximately 2 hours ago.  Symptoms started after she was leaning down.  Pain radiates to her bilateral shoulders.  She also has pain to her right abdomen.  She described pain as a sharp sensation, worse when she moves or takes a deep breath.  Pain is moderate to severe, unrelieved despite receiving a gram of Tylenol prior to arrival by EMS.  She has never had this pain before.  No report of fever, chills, chest pain, shortness of breath, productive cough, nausea, vomiting, focal numbness or weakness, bowel bladder incontinence or saddle anesthesia.  No prior history of kidney stones.  No history of AAA.  Denies any recent injury or lifting.  Denies any postprandial pain.  No prior history of PE or DVT, no recent surgery, prolonged bed rest, unilateral swelling or calf pain, active cancer or hemoptysis.     Past Medical History:  Diagnosis Date  . Chlamydia 1997  . Gonorrhea 1997  . Herpes   . Medical history non-contributory     Patient Active Problem List   Diagnosis Date Noted  . S/P cesarean section 06/09/2015  . 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 CESAREAN SECTION    . CESAREAN SECTION N/A 02/04/2013   Procedure: CESAREAN SECTION;  Surgeon: Kathreen Cosier, MD;  Location: [facility];  Service: Obstetrics;  Laterality: N/A;  . CESAREAN SECTION N/A 06/08/2015   Procedure: CESAREAN SECTION;  Surgeon: Kathreen Cosier, MD;  Location: [facility];  Service:  Obstetrics;  Laterality: N/A;  . NO PAST SURGERIES      OB History    Gravida Para Term Preterm AB Living   6 6 5 1  0 6   SAB TAB Ectopic Multiple Live Births         0 6       Home Medications    Prior to Admission medications   Medication Sig Start Date End Date Taking? Authorizing Provider  clindamycin (CLEOCIN) 300 MG capsule Take 1 capsule (300 mg total) by mouth 4 (four) times daily. For ten days 06/25/15   Katrinka Blazing, [place], CNM  ibuprofen (ADVIL,MOTRIN) 600 MG tablet Take 1 tablet (600 mg total) by mouth every 6 (six) hours as needed for moderate pain. 06/25/15   Katrinka Blazing, [place], CNM  magnesium hydroxide (MILK OF MAGNESIA) 400 MG/5ML suspension Take 15 mLs by mouth daily as needed for mild constipation.    [provider]  oxyCODONE-acetaminophen (PERCOCET/ROXICET) 5-325 MG tablet Take 1-2 tablets by mouth every 6 (six) hours as needed for severe pain. 06/25/15   Dorathy Kinsman, CNM    Family History Family History  Problem Relation Age of Onset  . Hypertension Mother   . Diabetes Father   . Cancer Maternal Grandfather   . Cancer Paternal Grandmother     Social History Social History   Tobacco Use  . Smoking status: Current Every Day Smoker    Packs/day: 0.15  . Smokeless tobacco: Never Used  Substance Use Topics  . Alcohol use: Yes  . Drug use: Yes    Types: Cocaine    Comment: patient states she used cocaine 5 days ago.     Allergies   Patient has no known allergies.   Review of Systems Review of Systems  All other systems reviewed and are negative.    Physical Exam Updated Vital Signs BP 138/90 (BP Location: Right Arm)   Pulse 85   Temp 98 F (36.7 C) (Oral)   Resp (!) 24   SpO2 100%   Physical Exam  Constitutional: She appears well-developed and well-nourished.  Appears uncomfortable, tearful  HENT:  Head: Atraumatic.  Eyes: Conjunctivae are normal.  Neck: Neck supple.  Cardiovascular: Normal rate, regular rhythm and intact  distal pulses.  Pulmonary/Chest: Effort normal and breath sounds normal. No respiratory distress. She exhibits no tenderness.  Abdominal: Soft. There is tenderness (Tenderness to right upper quadrant without guarding or rebound tenderness).  Musculoskeletal: Normal range of motion. She exhibits no edema.  No significant midline spine tenderness.  Mild tenderness to left thoracic paraspinal muscle on palpation.  No overlying skin changes.  Neurological: She is alert.  Skin: Capillary refill takes less than 2 seconds. No rash noted.  Psychiatric: She has a normal mood and affect.  Nursing note and vitals reviewed.    ED Treatments / Results  Labs (all labs ordered are listed, but only abnormal results are displayed) Labs Reviewed  CBC WITH DIFFERENTIAL/PLATELET - Abnormal; Notable for the following components:      Result Value   Hemoglobin 11.7 (*)    All other components within normal limits  URINALYSIS, ROUTINE W REFLEX MICROSCOPIC - Abnormal; Notable for the following components:   Hgb urine dipstick MODERATE (*)    Bacteria, UA RARE (*)    Squamous Epithelial / LPF 0-5 (*)    All other components within normal limits  HEPATIC FUNCTION PANEL - Abnormal; Notable for the following components:   Alkaline Phosphatase 37 (*)    Bilirubin, Direct <0.1 (*)    All other components within normal limits  I-STAT CHEM 8, ED - Abnormal; Notable for the following components:   Creatinine, Ser 0.40 (*)    Calcium, Ion 1.03 (*)    TCO2 19 (*)    Hemoglobin 11.6 (*)    HCT 34.0 (*)    All other components within normal limits  LIPASE, BLOOD  PREGNANCY, URINE    EKG  EKG Interpretation None       Radiology Ct Renal Stone Study  Result Date: 07/12/2017 CLINICAL DATA:  Back pain upper left onset today. EXAM: CT ABDOMEN AND PELVIS WITHOUT CONTRAST TECHNIQUE: Multidetector CT imaging of the abdomen and pelvis was performed following the standard protocol without IV contrast. COMPARISON:   None. FINDINGS: Lower chest: Mild bibasilar atelectasis. Hepatobiliary: Normal. Pancreas: Normal. Spleen: Normal. Adrenals/Urinary Tract: Adrenal glands are normal. Kidneys are normal in size without hydronephrosis or nephrolithiasis. The ureters and bladder are within normal. Stomach/Bowel: Stomach and small bowel are normal. Appendix is normal. Colon is normal. Vascular/Lymphatic: Within normal. Reproductive: Normal. Other: None. Musculoskeletal: Normal. IMPRESSION: No acute findings in the abdomen/pelvis. Electronically Signed   By: Elberta Fortis M.D.   On: 07/12/2017 18:13    Procedures Procedures (including critical care time)  Medications Ordered in ED Medications  ketorolac (TORADOL) 30 MG/ML injection 30 mg (not administered)  morphine 4 MG/ML injection 4 mg (4 mg Intravenous Given 07/12/17 1539)  ondansetron (ZOFRAN) injection 4 mg (4  mg Intravenous Given 07/12/17 1539)     Initial Impression / Assessment and Plan / ED Course  I have reviewed the triage vital signs and the nursing notes.  Pertinent labs & imaging results that were available during my care of the patient were reviewed by me and considered in my medical decision making (see chart for details).     BP 131/63 (BP Location: Right Arm)   Pulse 94   Temp 98 F (36.7 C) (Oral)   Resp 18   SpO2 100%    Final Clinical Impressions(s) / ED Diagnoses   Final diagnoses:  Acute left-sided low back pain without sciatica    ED Discharge Orders        Ordered    ibuprofen (ADVIL,MOTRIN) 600 MG tablet  Every 6 hours PRN     07/12/17 1942    cyclobenzaprine (FLEXERIL) 10 MG tablet  2 times daily PRN     07/12/17 1942     3:29 PM Patient here with acute onset of left back pain.  Pain.  No history of kidney stone.  Pain is mildly reproducible on exam but she does have tenderness to her right upper quadrant.  Pain is very nonspecific at this time.  Likely musculoskeletal but cannot rule out kidney stones or other acute  abdominal pathology.  Low suspicion for PE or dissection.  7:21 PM PERC negative, doubt PE.  Normal WBC, no evidence of anemia, normal lipase, normal hepatic function panel, and fairly normal i-STAT Chem-8.  Urine dip shows moderate amount of hemoglobin and urine dipstick.  CT scan of abdomen and pelvis without any acute finding.  7:44 PM Patient report moderate improvement of her symptoms after receiving pain medication here.  She is ambulate without difficulty, no hypoxia with ambulation.  Suspect musculoskeletal pain causing his symptoms.  Patient discharged home with some traumatic treatment but strict return precautions given.  Patient voiced understanding and agrees with plan.   Fayrene Helperran, Denesha Brouse, PA-C 07/12/17 1944    Tegeler, Canary Brimhristopher J, MD 07/13/17 [phone]

## 2017-07-12 NOTE — ED Notes (Signed)
Pt. Ambulated on pulse ox, and saturation remained at 100 percent.

## 2017-07-12 NOTE — ED Notes (Signed)
Patient transported to CT 

## 2017-07-12 NOTE — ED Triage Notes (Signed)
Pt arrives via EMS from home with complaints of sudden sharp back pain that began around 1330. Pt endorses taking tums with no relief when she though it was indigestion. Reports pain comes and goes and is in between her shoulder blades. EMS gave 1g tylenol at 1410 for pain

## 2017-07-12 NOTE — ED Notes (Signed)
Pt verbalized understanding of discharge paperwork and prescriptions.  °

## 2018-05-30 ENCOUNTER — Encounter (HOSPITAL_COMMUNITY): Payer: Self-pay

## 2018-05-30 ENCOUNTER — Other Ambulatory Visit: Payer: Self-pay

## 2018-05-30 ENCOUNTER — Emergency Department (HOSPITAL_COMMUNITY)
Admission: EM | Admit: 2018-05-30 | Discharge: 2018-05-30 | Disposition: A | Discharge status: Home / Self Care | Payer: Medicaid Other | Attending: Emergency Medicine | Admitting: Emergency Medicine

## 2018-05-30 ENCOUNTER — Emergency Department (HOSPITAL_COMMUNITY): Payer: Medicaid Other

## 2018-05-30 DIAGNOSIS — J0140 Acute pansinusitis, unspecified: Secondary | ICD-10-CM

## 2018-05-30 DIAGNOSIS — R05 Cough: Secondary | ICD-10-CM | POA: Diagnosis present

## 2018-05-30 DIAGNOSIS — F17200 Nicotine dependence, unspecified, uncomplicated: Secondary | ICD-10-CM | POA: Insufficient documentation

## 2018-05-30 LAB — CBC WITH DIFFERENTIAL/PLATELET
Abs Immature Granulocytes: 0.05 10*3/uL (ref 0.00–0.07)
BASOS ABS: 0 10*3/uL (ref 0.0–0.1)
Basophils Relative: 0 %
EOS PCT: 1 %
Eosinophils Absolute: 0.1 10*3/uL (ref 0.0–0.5)
HCT: 38.5 % (ref 36.0–46.0)
HEMOGLOBIN: 11.8 g/dL — AB (ref 12.0–15.0)
Immature Granulocytes: 1 %
LYMPHS PCT: 19 %
Lymphs Abs: 1.5 10*3/uL (ref 0.7–4.0)
MCH: 27.3 pg (ref 26.0–34.0)
MCHC: 30.6 g/dL (ref 30.0–36.0)
MCV: 89.1 fL (ref 80.0–100.0)
MONO ABS: 0.6 10*3/uL (ref 0.1–1.0)
Monocytes Relative: 7 %
NRBC: 0 % (ref 0.0–0.2)
Neutro Abs: 5.7 10*3/uL (ref 1.7–7.7)
Neutrophils Relative %: 72 %
Platelets: 301 10*3/uL (ref 150–400)
RBC: 4.32 MIL/uL (ref 3.87–5.11)
RDW: 14.9 % (ref 11.5–15.5)
WBC: 8 10*3/uL (ref 4.0–10.5)

## 2018-05-30 LAB — COMPREHENSIVE METABOLIC PANEL
ALT: 21 U/L (ref 0–44)
AST: 26 U/L (ref 15–41)
Albumin: 4.2 g/dL (ref 3.5–5.0)
Alkaline Phosphatase: 49 U/L (ref 38–126)
Anion gap: 10 (ref 5–15)
BUN: 11 mg/dL (ref 6–20)
CALCIUM: 9.1 mg/dL (ref 8.9–10.3)
CHLORIDE: 103 mmol/L (ref 98–111)
CO2: 24 mmol/L (ref 22–32)
CREATININE: 0.72 mg/dL (ref 0.44–1.00)
Glucose, Bld: 95 mg/dL (ref 70–99)
Potassium: 3.8 mmol/L (ref 3.5–5.1)
Sodium: 137 mmol/L (ref 135–145)
Total Bilirubin: 0.3 mg/dL (ref 0.3–1.2)
Total Protein: 7.8 g/dL (ref 6.5–8.1)

## 2018-05-30 LAB — I-STAT BETA HCG BLOOD, ED (MC, WL, AP ONLY)

## 2018-05-30 LAB — I-STAT CG4 LACTIC ACID, ED: LACTIC ACID, VENOUS: 0.95 mmol/L (ref 0.5–1.9)

## 2018-05-30 MED ORDER — BENZONATATE 100 MG PO CAPS: 100.0000 mg | ORAL_CAPSULE | Freq: Three times a day (TID) | ORAL | 0 refills | Status: DC

## 2018-05-30 MED ORDER — AMOXICILLIN-POT CLAVULANATE 875-125 MG PO TABS: 1.0000 | ORAL_TABLET | Freq: Two times a day (BID) | ORAL | 0 refills | Status: AC

## 2018-05-30 NOTE — Discharge Instructions (Addendum)
Your prescription for your cough medicine, Anna Calhoun Nearingessalon, is written is to take 1 pill by mouth every 8 hours.  As we discussed your cough is most likely protective in helping you keep from getting a pneumonia.  Please try to only take this medicine at night.  Please take Ibuprofen (Advil, motrin) and Tylenol (acetaminophen) to relieve your pain.  You may take up to 600 MG (3 pills) of normal strength ibuprofen every 8 hours as needed.  In between doses of ibuprofen you make take tylenol, up to 1,000 mg (two extra strength pills).  Do not take more than 3,000 mg tylenol in a 24 hour period.  Please check all medication labels as many medications such as pain and cold medications may contain tylenol.  Do not drink alcohol while taking these medications.  Do not take other NSAID'S while taking ibuprofen (such as aleve or naproxen).  Please take ibuprofen with food to decrease stomach upset.  You may have diarrhea from the antibiotics.  It is very important that you continue to take the antibiotics even if you get diarrhea unless a medical professional tells you that you may stop taking them.  If you stop too early the bacteria you are being treated for will become stronger and you may need different, more powerful antibiotics that have more side effects and worsening diarrhea.  Please stay well hydrated and consider probiotics as they may decrease the severity of your diarrhea.  Please be aware that if you take any hormonal contraception (birth control pills, nexplanon, the ring, etc) that your birth control will not work while you are taking antibiotics and you need to use back up protection as directed on the birth control medication information insert.

## 2018-05-30 NOTE — ED Notes (Signed)
Pt states she is unable to provide urine at this time 

## 2018-05-30 NOTE — ED Triage Notes (Signed)
Pt states she has had a cough for about 2-3 weeks. Pt states that she has a headache as well, and [facility]. Pt states that she feels like she can't get the mucous out of her chest. Pt states she has had generaized body aches and can't eat.

## 2018-05-30 NOTE — ED Provider Notes (Signed)
[facility]-EMERGENCY DEPT Provider Note   CSN: [id number] Arrival date & time: 05/30/18  1332     History   Chief Complaint Chief Complaint  Patient presents with  . Cough  . Generalized Body Aches    HPI Anna Litterasha L Calhoun is a 37 y.o. female who presents today for evaluation of cough for about 2 to 3 weeks.  She reports feeling like she had a cold that initially got better and then got much worse again.  She reports that she has had generalized body aches, poor appetite with subjective fevers at home.  She denies any abdominal pain, chest pain, or shortness of breath.  She reports sinus pressure including pain on her forehead and under both eyes.  Triage note reports headache and shortness of breath when she talks, however patient did not mention these to me.    HPI  Past Medical History:  Diagnosis Date  . Chlamydia 1997  . Gonorrhea 1997  . Herpes   . Medical history non-contributory     Patient Active Problem List   Diagnosis Date Noted  . S/P cesarean section 06/09/2015  . 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 CESAREAN SECTION    . CESAREAN SECTION N/A 02/04/2013   Procedure: CESAREAN SECTION;  Surgeon: Kathreen CosierBernard A Marshall, MD;  Location: [facility];  Service: Obstetrics;  Laterality: N/A;  . CESAREAN SECTION N/A 06/08/2015   Procedure: CESAREAN SECTION;  Surgeon: Kathreen CosierBernard A Marshall, MD;  Location: [facility];  Service: Obstetrics;  Laterality: N/A;  . NO PAST SURGERIES       OB History    Gravida  6   Para  6   Term  5   Preterm  1   AB  0   Living  6     SAB      TAB      Ectopic      Multiple  0   Live Births  6            Home Medications    Prior to Admission medications   Medication Sig Start Date End Date Taking? Authorizing Provider  amoxicillin-clavulanate (AUGMENTIN) 875-125 MG  tablet Take 1 tablet by mouth 2 (two) times daily for 14 days. 05/30/18 06/13/18  Cristina GongHammond, Elizabeth W, PA-C  benzonatate (TESSALON) 100 MG capsule Take 1 capsule (100 mg total) by mouth every 8 (eight) hours. 05/30/18   Cristina GongHammond, Elizabeth W, PA-C  cyclobenzaprine (FLEXERIL) 10 MG tablet Take 1 tablet (10 mg total) by mouth 2 (two) times daily as needed for muscle spasms. Patient not taking: Reported on 05/30/2018 07/12/17   Fayrene Helperran, [place], PA-C  ibuprofen (ADVIL,MOTRIN) 600 MG tablet Take 1 tablet (600 mg total) by mouth every 6 (six) hours as needed for moderate pain. Patient not taking: Reported on 05/30/2018 07/12/17   Fayrene Helperran, [place], PA-C    Family History Family History  Problem Relation Age of Onset  . Hypertension Mother   . Diabetes Father   . Cancer Maternal Grandfather   . Cancer Paternal Grandmother     Social History Social History   Tobacco Use  . Smoking status: Current Every Day Smoker    Packs/day: 0.15  . Smokeless tobacco: Never Used  Substance Use Topics  . Alcohol use: Yes  . Drug use: Not Currently    Types: Cocaine    Comment: pt states  last use "years ago"     Allergies   Patient has no known allergies.   Review of Systems Review of Systems  Constitutional: Positive for fever (Subjective). Negative for chills.  HENT: Positive for congestion, ear pain, postnasal drip, rhinorrhea, sinus pressure, sinus pain and sore throat. Negative for trouble swallowing and voice change.   Respiratory: Positive for cough. Negative for chest tightness and shortness of breath.   Cardiovascular: Negative for chest pain and palpitations.  Gastrointestinal: Negative for abdominal pain, diarrhea, nausea and vomiting.  Musculoskeletal: Negative for neck pain and neck stiffness.  Neurological: Negative for headaches.  All other systems reviewed and are negative.    Physical Exam Updated Vital Signs BP 113/80 (BP Location: Left Arm)   Pulse 90   Temp 98.6 F (37 C) (Oral)    Resp 18   Ht 5' (1.524 m)   Wt 78.5 kg   LMP 05/15/2018   SpO2 99%   BMI 33.79 kg/m   Physical Exam Vitals signs and nursing note reviewed.  Constitutional:      General: She is not in acute distress.    Appearance: Normal appearance. She is well-developed. She is not toxic-appearing or diaphoretic.  HENT:     Head: Normocephalic and atraumatic.     Right Ear: Tympanic membrane, ear canal and external ear normal.     Left Ear: Tympanic membrane, ear canal and external ear normal.     Nose: Mucosal edema and rhinorrhea present.     Right Sinus: Maxillary sinus tenderness and frontal sinus tenderness present.     Left Sinus: Maxillary sinus tenderness and frontal sinus tenderness present.     Mouth/Throat:     Lips: No lesions.     Mouth: Mucous membranes are moist.     Pharynx: Oropharynx is clear. Uvula midline. No oropharyngeal exudate.     Tonsils: No tonsillar exudate or tonsillar abscesses.  Eyes:     General: No scleral icterus.       Right eye: No discharge.        Left eye: No discharge.     Conjunctiva/sclera: Conjunctivae normal.  Neck:     Musculoskeletal: Normal range of motion and neck supple.  Cardiovascular:     Rate and Rhythm: Normal rate and regular rhythm.  Pulmonary:     Effort: Pulmonary effort is normal. No respiratory distress.     Breath sounds: Normal breath sounds. No stridor. No wheezing.  Abdominal:     General: There is no distension.  Musculoskeletal:        General: No deformity.  Lymphadenopathy:     Cervical: No cervical adenopathy.  Skin:    General: Skin is warm and dry.  Neurological:     Mental Status: She is alert.     Motor: No abnormal muscle tone.  Psychiatric:        Behavior: Behavior normal.      ED Treatments / Results  Labs (all labs ordered are listed, but only abnormal results are displayed) Labs Reviewed  CBC WITH DIFFERENTIAL/PLATELET - Abnormal; Notable for the following components:      Result Value    Hemoglobin 11.8 (*)    All other components within normal limits  COMPREHENSIVE METABOLIC PANEL  I-STAT CG4 LACTIC ACID, ED  I-STAT BETA HCG BLOOD, ED ([facility], [facility], [facility] ONLY)    EKG EKG Interpretation  Date/Time:  Thursday May 30 2018 13:42:55 EST Ventricular Rate:  88 PR Interval:    QRS Duration: 86  QT Interval:  373 QTC Calculation: 452 R Axis:   48 Text Interpretation:  Sinus rhythm Short PR interval Nonspecific T abnormalities, anterior leads no prior to compare with Confirmed by Meridee ScoreButler, Michael [phone]([id number]) on 05/30/2018 4:54:07 PM   Radiology Dg Chest 2 View  Result Date: 05/30/2018 CLINICAL DATA:  Cough EXAM: CHEST - 2 VIEW COMPARISON:  September 05, 2010 FINDINGS: There is no edema or consolidation. The heart size and pulmonary vascularity are normal. No adenopathy. No bone lesions. IMPRESSION: No edema or consolidation. Electronically Signed   By: Bretta BangWilliam  Woodruff III M.D.   On: 05/30/2018 15:13    Procedures Procedures (including critical care time)  Medications Ordered in ED Medications - No data to display   Initial Impression / Assessment and Plan / ED Course  I have reviewed the triage vital signs and the nursing notes.  Pertinent labs & imaging results that were available during my care of the patient were reviewed by me and considered in my medical decision making (see chart for details).    Patient complaining of symptoms of sinusitis.    Severe symptoms have been present for greater than 10 days with purulent nasal discharge and maxillary sinus pain.  Concern for acute bacterial rhinosinusitis.  Labs had been ordered from triage.  No significant leukocytosis.  Very mild anemia, patient was informed of this.  Chest x-ray does not show evidence of consolidation pneumothorax or other acute abnormality.  EKG is reassuring.  She is afebrile, not tachycardic or tachypneic, lactic acid is not elevated.  Patient discharged with Augmentin.  Instructions given for warm  saline nasal wash and recommendations for follow-up with primary care physician.  She is given a prescription for Tessalon cough medicine with instructions to only take it at night.   Final Clinical Impressions(s) / ED Diagnoses   Final diagnoses:  Acute non-recurrent pansinusitis    ED Discharge Orders         Ordered    amoxicillin-clavulanate (AUGMENTIN) 875-125 MG tablet  2 times daily     05/30/18 1637    benzonatate (TESSALON) 100 MG capsule  Every 8 hours     05/30/18 1637           Norman ClayHammond, Elizabeth W, PA-C 05/30/18 2208    Terrilee FilesButler, Michael C, MD 05/30/18 [phone]

## 2019-02-20 ENCOUNTER — Other Ambulatory Visit: Payer: Self-pay

## 2019-02-20 ENCOUNTER — Emergency Department (HOSPITAL_COMMUNITY)
Admission: EM | Admit: 2019-02-20 | Discharge: 2019-02-21 | Disposition: A | Discharge status: Home / Self Care | Payer: Medicaid Other | Attending: Emergency Medicine | Admitting: Emergency Medicine

## 2019-02-20 ENCOUNTER — Emergency Department (HOSPITAL_COMMUNITY): Payer: Medicaid Other

## 2019-02-20 DIAGNOSIS — F17210 Nicotine dependence, cigarettes, uncomplicated: Secondary | ICD-10-CM | POA: Diagnosis not present

## 2019-02-20 DIAGNOSIS — R591 Generalized enlarged lymph nodes: Secondary | ICD-10-CM

## 2019-02-20 DIAGNOSIS — J029 Acute pharyngitis, unspecified: Secondary | ICD-10-CM | POA: Diagnosis present

## 2019-02-20 LAB — BASIC METABOLIC PANEL
Anion gap: 10 (ref 5–15)
BUN: 14 mg/dL (ref 6–20)
CO2: 21 mmol/L — ABNORMAL LOW (ref 22–32)
Calcium: 8.8 mg/dL — ABNORMAL LOW (ref 8.9–10.3)
Chloride: 104 mmol/L (ref 98–111)
Creatinine, Ser: 0.88 mg/dL (ref 0.44–1.00)
GFR calc Af Amer: >60 mL/min (ref >60)
GFR calc non Af Amer: >60 mL/min (ref >60)
Glucose, Bld: 86 mg/dL (ref 70–99)
Potassium: 3.7 mmol/L (ref 3.5–5.1)
Sodium: 135 mmol/L (ref 135–145)

## 2019-02-20 LAB — CBC WITH DIFFERENTIAL/PLATELET
Abs Immature Granulocytes: 0.02 10*3/uL (ref 0.00–0.07)
Basophils Absolute: 0 10*3/uL (ref 0.0–0.1)
Basophils Relative: 0 %
Eosinophils Absolute: 0.2 10*3/uL (ref 0.0–0.5)
Eosinophils Relative: 2 %
HCT: 31.8 % — ABNORMAL LOW (ref 36.0–46.0)
Hemoglobin: 9.7 g/dL — ABNORMAL LOW (ref 12.0–15.0)
Immature Granulocytes: 0 %
Lymphocytes Relative: 34 %
Lymphs Abs: 2.9 10*3/uL (ref 0.7–4.0)
MCH: 26.6 pg (ref 26.0–34.0)
MCHC: 30.5 g/dL (ref 30.0–36.0)
MCV: 87.4 fL (ref 80.0–100.0)
Monocytes Absolute: 0.5 10*3/uL (ref 0.1–1.0)
Monocytes Relative: 6 %
Neutro Abs: 4.8 10*3/uL (ref 1.7–7.7)
Neutrophils Relative %: 58 %
Platelets: 339 10*3/uL (ref 150–400)
RBC: 3.64 MIL/uL — ABNORMAL LOW (ref 3.87–5.11)
RDW: 14.7 % (ref 11.5–15.5)
WBC: 8.4 10*3/uL (ref 4.0–10.5)
nRBC: 0 % (ref 0.0–0.2)

## 2019-02-20 LAB — GROUP A STREP BY PCR: Group A Strep by PCR: NOT DETECTED

## 2019-02-20 MED ORDER — IOHEXOL 300 MG/ML  SOLN
75.0000 mL | Freq: Once | INTRAMUSCULAR | Status: AC | PRN
  Administered 2019-02-20: 75 mL via INTRAVENOUS

## 2019-02-20 MED ORDER — ACETAMINOPHEN 325 MG PO TABS
650.0000 mg | ORAL_TABLET | Freq: Once | ORAL | Status: AC | PRN
  Administered 2019-02-20: 650 mg via ORAL
  Filled 2019-02-20: qty 2

## 2019-02-20 NOTE — ED Triage Notes (Signed)
Patient reports onset of L sided facial/submandibular swelling today as well as sore throat. C/o L side of face and ear hurting. Denies fevers/chills.

## 2019-02-20 NOTE — ED Provider Notes (Signed)
[facility] EMERGENCY DEPARTMENT Provider Note   CSN: [id number] Arrival date & time: 02/20/19  1734     History   Chief Complaint Chief Complaint  Patient presents with  . Facial Swelling  . Sore Throat    HPI Anna Calhoun is a 38 y.o. female.     HPI   38 year old female presents today with complaints of throat pain.  Patient notes approximate 1 month ago she felt pain in her left neck and throat.  She notes this is worse with swallowing.  This is slowly gotten worse.  She notes over the last day she has had swelling under her left jaw and neck with tenderness.  She also notes some tenderness to the areas below her bilateral ears.  She denies any fever, denies any cough congestion rhinorrhea or nasal congestion.  She reports she occasionally smokes no weight loss no history of cancer or night sweats.   Past Medical History:  Diagnosis Date  . Chlamydia 1997  . Gonorrhea 1997  . Herpes   . Medical history non-contributory     Patient Active Problem List   Diagnosis Date Noted  . S/P cesarean section 06/09/2015  . 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 HSV-2 infection complicating pregnancy 47/65/4650    Past Surgical History:  Procedure Laterality Date  . CESAREAN SECTION    . CESAREAN SECTION N/A 02/04/2013   Procedure: CESAREAN SECTION;  Surgeon: Frederico Hamman, MD;  Location: [facility];  Service: Obstetrics;  Laterality: N/A;  . CESAREAN SECTION N/A 06/08/2015   Procedure: CESAREAN SECTION;  Surgeon: Frederico Hamman, MD;  Location: [facility];  Service: Obstetrics;  Laterality: N/A;  . NO PAST SURGERIES       OB History    Gravida  6   Para  6   Term  5   Preterm  1   AB  0   Living  6     SAB      TAB      Ectopic      Multiple  0   Live Births  6            Home Medications    Prior to Admission medications   Medication Sig Start Date End Date Taking? Authorizing Provider  benzonatate  (TESSALON) 100 MG capsule Take 1 capsule (100 mg total) by mouth every 8 (eight) hours. 05/30/18   Lorin Glass, PA-C  cyclobenzaprine (FLEXERIL) 10 MG tablet Take 1 tablet (10 mg total) by mouth 2 (two) times daily as needed for muscle spasms. Patient not taking: Reported on 05/30/2018 07/12/17   Domenic Moras, PA-C  ibuprofen (ADVIL,MOTRIN) 600 MG tablet Take 1 tablet (600 mg total) by mouth every 6 (six) hours as needed for moderate pain. Patient not taking: Reported on 05/30/2018 07/12/17   Domenic Moras, PA-C    Family History Family History  Problem Relation Age of Onset  . Hypertension Mother   . Diabetes Father   . Cancer Maternal Grandfather   . Cancer Paternal Grandmother     Social History Social History   Tobacco Use  . Smoking status: Current Every Day Smoker    Packs/day: 0.15  . Smokeless tobacco: Never Used  Substance Use Topics  . Alcohol use: Yes  . Drug use: Not Currently    Types: Cocaine    Comment: pt states last use "years ago"     Allergies   Patient has no known  allergies.   Review of Systems Review of Systems  All other systems reviewed and are negative.   Physical Exam Updated Vital Signs BP 137/77 (BP Location: Left Arm)   Pulse 90   Temp 98.9 F (37.2 C) (Oral)   Resp 16   LMP 02/08/2019 (Approximate)   SpO2 100%   Physical Exam Vitals signs and nursing note reviewed.  Constitutional:      Appearance: She is well-developed.  HENT:     Head: Normocephalic and atraumatic.     Comments: Oropharynx is clear no erythema edema or exudate, uvula midline rises with phonation Eyes:     General: No scleral icterus.       Right eye: No discharge.        Left eye: No discharge.     Conjunctiva/sclera: Conjunctivae normal.     Pupils: Pupils are equal, round, and reactive to light.  Neck:     Musculoskeletal: Normal range of motion.     Vascular: No JVD.     Trachea: No tracheal deviation.     Comments: Bilateral enlarged  submandibular lymphadenopathy Pulmonary:     Effort: Pulmonary effort is normal.     Breath sounds: No stridor.  Neurological:     Mental Status: She is alert and oriented to person, place, and time.     Coordination: Coordination normal.  Psychiatric:        Behavior: Behavior normal.        Thought Content: Thought content normal.        Judgment: Judgment normal.      ED Treatments / Results  Labs (all labs ordered are listed, but only abnormal results are displayed) Labs Reviewed  GROUP A STREP BY PCR  CBC WITH DIFFERENTIAL/PLATELET  BASIC METABOLIC PANEL    EKG None  Radiology No results found.  Procedures Procedures (including critical care time)  Medications Ordered in ED Medications  acetaminophen (TYLENOL) tablet 650 mg (650 mg Oral Given 02/20/19 1802)     Initial Impression / Assessment and Plan / ED Course  I have reviewed the triage vital signs and the nursing notes.  Pertinent labs & imaging results that were available during my care of the patient were reviewed by me and considered in my medical decision making (see chart for details).        Labs: Strep  Imaging: CT soft tissue neck  Consults:  Therapeutics:  Discharge Meds:   Assessment/Plan: 38 year old female presents today with neck swelling and pain.  This is been ongoing for the last month worsening today.  Given her duration of symptoms worsening today will obtain CT soft tissue neck.  No lymphadenopathy.  Patient has no acute signs of infection here negative strep.  Patient care signed to oncoming provider pending CT results and disposition.   Final Clinical Impressions(s) / ED Diagnoses   Final diagnoses:  Lymphadenopathy    ED Discharge Orders    None       Rosalio LoudHedges, Nastasha Reising, PA-C 02/20/19 2140    Charlynne PanderYao, David Hsienta, MD 02/20/19 [phone]

## 2019-02-21 NOTE — ED Notes (Signed)
Patient verbalizes understanding of discharge instructions. Opportunity for questioning and answers were provided. Armband removed by staff, pt discharged from ED ambulatory.   

## 2020-02-14 ENCOUNTER — Encounter (HOSPITAL_COMMUNITY): Payer: Self-pay | Admitting: Emergency Medicine

## 2020-02-14 ENCOUNTER — Emergency Department (HOSPITAL_COMMUNITY)
Admission: EM | Admit: 2020-02-14 | Discharge: 2020-02-14 | Disposition: A | Discharge status: Home / Self Care | Payer: Medicaid Other | Attending: Emergency Medicine | Admitting: Emergency Medicine

## 2020-02-14 ENCOUNTER — Emergency Department (HOSPITAL_COMMUNITY): Payer: Medicaid Other

## 2020-02-14 ENCOUNTER — Other Ambulatory Visit: Payer: Self-pay

## 2020-02-14 DIAGNOSIS — F17200 Nicotine dependence, unspecified, uncomplicated: Secondary | ICD-10-CM | POA: Insufficient documentation

## 2020-02-14 DIAGNOSIS — M546 Pain in thoracic spine: Secondary | ICD-10-CM | POA: Insufficient documentation

## 2020-02-14 DIAGNOSIS — U071 COVID-19: Secondary | ICD-10-CM | POA: Diagnosis not present

## 2020-02-14 DIAGNOSIS — R0602 Shortness of breath: Secondary | ICD-10-CM | POA: Diagnosis present

## 2020-02-14 DIAGNOSIS — Z79899 Other long term (current) drug therapy: Secondary | ICD-10-CM | POA: Insufficient documentation

## 2020-02-14 DIAGNOSIS — Z7982 Long term (current) use of aspirin: Secondary | ICD-10-CM | POA: Diagnosis not present

## 2020-02-14 LAB — BASIC METABOLIC PANEL
Anion gap: 7 (ref 5–15)
BUN: 11 mg/dL (ref 6–20)
CO2: 24 mmol/L (ref 22–32)
Calcium: 8.7 mg/dL — ABNORMAL LOW (ref 8.9–10.3)
Chloride: 107 mmol/L (ref 98–111)
Creatinine, Ser: 0.74 mg/dL (ref 0.44–1.00)
GFR calc Af Amer: >60 mL/min (ref >60)
GFR calc non Af Amer: >60 mL/min (ref >60)
Glucose, Bld: 88 mg/dL (ref 70–99)
Potassium: 3.6 mmol/L (ref 3.5–5.1)
Sodium: 138 mmol/L (ref 135–145)

## 2020-02-14 LAB — URINALYSIS, ROUTINE W REFLEX MICROSCOPIC
Bilirubin Urine: NEGATIVE
Glucose, UA: NEGATIVE mg/dL
Hgb urine dipstick: NEGATIVE
Ketones, ur: NEGATIVE mg/dL
Leukocytes,Ua: NEGATIVE
Nitrite: NEGATIVE
Protein, ur: NEGATIVE mg/dL
Specific Gravity, Urine: 1.021 (ref 1.005–1.030)
pH: 6 (ref 5.0–8.0)

## 2020-02-14 LAB — I-STAT BETA HCG BLOOD, ED (MC, WL, AP ONLY): I-stat hCG, quantitative: <5 m[IU]/mL (ref <5)

## 2020-02-14 LAB — CBC
HCT: 36.9 % (ref 36.0–46.0)
Hemoglobin: 11.3 g/dL — ABNORMAL LOW (ref 12.0–15.0)
MCH: 27 pg (ref 26.0–34.0)
MCHC: 30.6 g/dL (ref 30.0–36.0)
MCV: 88.3 fL (ref 80.0–100.0)
Platelets: 260 10*3/uL (ref 150–400)
RBC: 4.18 MIL/uL (ref 3.87–5.11)
RDW: 15 % (ref 11.5–15.5)
WBC: 3.8 10*3/uL — ABNORMAL LOW (ref 4.0–10.5)
nRBC: 0 % (ref 0.0–0.2)

## 2020-02-14 MED ORDER — SODIUM CHLORIDE 0.9% FLUSH: 3.0000 mL | Freq: Once | INTRAVENOUS | Status: DC

## 2020-02-14 NOTE — ED Provider Notes (Signed)
[facility] EMERGENCY DEPARTMENT Provider Note   CSN: [id number] Arrival date & time: 02/14/20  1146     History Chief Complaint  Patient presents with   Covid Positive   Back Pain    Anna Calhoun is a 39 y.o. female.  39 year old female presents with complaint of pain in her right upper back when taking a deep breath.  Patient states that she has had symptoms for the past 3 days, and got her positive Covid test result today and is concerned she has pneumonia.  Denies fevers, chills.  Patient is not vaccinated.  No other complaints or concerns today.  Anna Calhoun was evaluated in Emergency Department on 02/14/2020 for the symptoms described in the history of present illness. She was evaluated in the context of the global COVID-19 pandemic, which necessitated consideration that the patient might be at risk for infection with the SARS-CoV-2 virus that causes COVID-19. Institutional protocols and algorithms that pertain to the evaluation of patients at risk for COVID-19 are in a state of rapid change based on information released by regulatory bodies including the CDC and federal and state organizations. These policies and algorithms were followed during the patient's care in the ED.         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    Past Surgical History:  Procedure Laterality Date   CESAREAN SECTION     CESAREAN SECTION N/A 02/04/2013   Procedure: CESAREAN SECTION;  Surgeon: Kathreen Cosier, MD;  Location: [facility];  Service: Obstetrics;  Laterality: N/A;   CESAREAN SECTION N/A 06/08/2015   Procedure: CESAREAN SECTION;  Surgeon: Kathreen Cosier, MD;  Location:  [facility];  Service: Obstetrics;  Laterality: N/A;   NO PAST SURGERIES       OB History    Gravida  6   Para  6   Term  5   Preterm  1   AB  0   Living  6     SAB      TAB      Ectopic      Multiple  0   Live Births  6           Family History  Problem Relation Age of Onset   Hypertension Mother    Diabetes Father    Cancer Maternal Grandfather    Cancer Paternal Grandmother     Social History   Tobacco Use   Smoking status: Current Every Day Smoker    Packs/day: 0.15   Smokeless tobacco: Never Used  Vaping Use   Vaping Use: Never used  Substance Use Topics   Alcohol use: Yes   Drug use: Not Currently    Types: Cocaine    Comment: pt states last use "years ago"    Home Medications Prior to Admission medications   Medication Sig Start Date End Date Taking? Authorizing Provider  acetaminophen (TYLENOL) 500 MG tablet Take 1,000 mg by mouth every 8 (eight) hours as needed for mild pain or moderate pain (body ache).    [provider]  aspirin EC 81 MG tablet Take 81 mg by mouth as needed for mild pain.  [provider]  benzonatate (TESSALON) 100 MG capsule Take 1 capsule (100 mg total) by mouth every 8 (eight) hours. Patient not taking: Reported on 02/20/2019 05/30/18   Cristina Gong, PA-C  cyclobenzaprine (FLEXERIL) 10 MG tablet Take 1 tablet (10 mg total) by mouth 2 (two) times daily as needed for muscle spasms. Patient not taking: Reported on 05/30/2018 07/12/17   Fayrene Helper, PA-C  ibuprofen (ADVIL,MOTRIN) 600 MG tablet Take 1 tablet (600 mg total) by mouth every 6 (six) hours as needed for moderate pain. Patient not taking: Reported on 05/30/2018 07/12/17   Fayrene Helper, PA-C    Allergies    Patient has no known allergies.  Review of Systems   Review of Systems  Constitutional: Negative for chills and fever.  HENT: Negative for congestion and sore throat.   Respiratory: Positive for cough and shortness of  breath.   Gastrointestinal: Negative for diarrhea, nausea and vomiting.  Musculoskeletal: Positive for back pain. Negative for arthralgias and myalgias.  Skin: Negative for rash and wound.  Allergic/Immunologic: Negative for immunocompromised state.  Neurological: Negative for weakness.  All other systems reviewed and are negative.   Physical Exam Updated Vital Signs BP 131/74 (BP Location: Right Arm)    Pulse 70    Temp 98.4 F (36.9 C) (Oral)    Resp 16    LMP 01/17/2020    SpO2 100%   Physical Exam Vitals and nursing note reviewed.  Constitutional:      General: She is not in acute distress.    Appearance: She is well-developed. She is not diaphoretic.  HENT:     Head: Normocephalic and atraumatic.  Cardiovascular:     Rate and Rhythm: Normal rate and regular rhythm.     Pulses: Normal pulses.     Heart sounds: Normal heart sounds.  Pulmonary:     Effort: Pulmonary effort is normal.     Breath sounds: Normal breath sounds.  Musculoskeletal:        General: No swelling or tenderness.  Skin:    General: Skin is warm and dry.     Findings: No erythema or rash.  Neurological:     Mental Status: She is alert and oriented to person, place, and time.  Psychiatric:        Behavior: Behavior normal.     ED Results / Procedures / Treatments   Labs (all labs ordered are listed, but only abnormal results are displayed) Labs Reviewed  BASIC METABOLIC PANEL - Abnormal; Notable for the following components:      Result Value   Calcium 8.7 (*)    All other components within normal limits  CBC - Abnormal; Notable for the following components:   WBC 3.8 (*)    Hemoglobin 11.3 (*)    All other components within normal limits  URINALYSIS, ROUTINE W REFLEX MICROSCOPIC  I-STAT BETA HCG BLOOD, ED ([facility], [facility], [facility] ONLY)    EKG EKG Interpretation  Date/Time:  Saturday February 14 2020 15:35:49 EDT Ventricular Rate:  77 PR Interval:    QRS Duration: 92 QT Interval:  391 QTC  Calculation: 443 R Axis:   60 Text Interpretation: Sinus rhythm Borderline short PR interval Borderline T abnormalities, anterior leads Confirmed by Eber Hong ([id number]) on 02/14/2020 4:18:56 PM   Radiology DG Chest Port 1 View  Result Date: 02/14/2020 CLINICAL DATA:  Shortness of breath. Positive COVID-19 test 3 days ago. EXAM: PORTABLE CHEST 1 VIEW COMPARISON:  05/30/2018 FINDINGS: The heart size and mediastinal  contours are within normal limits. Both lungs are clear. The visualized skeletal structures are unremarkable. IMPRESSION: Normal examination. Electronically Signed   By: Beckie Salts M.D.   On: 02/14/2020 16:09    Procedures Procedures (including critical care time)  Medications Ordered in ED Medications  sodium chloride flush (NS) 0.9 % injection 3 mL (has no administration in time range)    ED Course  I have reviewed the triage vital signs and the nursing notes.  Pertinent labs & imaging results that were available during my care of the patient were reviewed by me and considered in my medical decision making (see chart for details).  Clinical Course as of Feb 14 1628  Sat Feb 14, 2020  [id number] 39 year old female with complaint of right upper back pain, known Covid infection.  On exam, well-appearing, nontoxic, vitals reassuring.  Labs reassuring.  X-ray unremarkable.  Patient to follow-up with PCP or Covid clinic, recommend Tylenol Mucinex as needed as directed.   [LM]    Clinical Course User Index [LM] Alden Hipp   MDM Rules/Calculators/A&P                          Final Clinical Impression(s) / ED Diagnoses Final diagnoses:  COVID-19  Acute right-sided thoracic back pain    Rx / DC Orders ED Discharge Orders    None       Alden Hipp 02/14/20 1629    Eber Hong, MD 02/14/20 1949

## 2020-02-14 NOTE — ED Triage Notes (Signed)
Pt states she was tested 3 days ago for COVID and received + test results today.  C/o upper back pain, SOB, and dizziness.

## 2020-02-14 NOTE — Discharge Instructions (Signed)
Your chest x-ray today is normal. Take Tylenol and Mucinex as needed as directed. Follow-up with your primary care provider.  Referral given to Covid clinic to follow-up if needed.

## 2020-09-11 IMAGING — DX DG CHEST 1V PORT
1 series · 1 of 1 positions shown · non-contrast
Comparison: 05/30/2018

CLINICAL DATA: Shortness of breath. Positive SWOM1-ZF test 3 days
ago.

EXAM:
PORTABLE CHEST 1 VIEW

[chest]
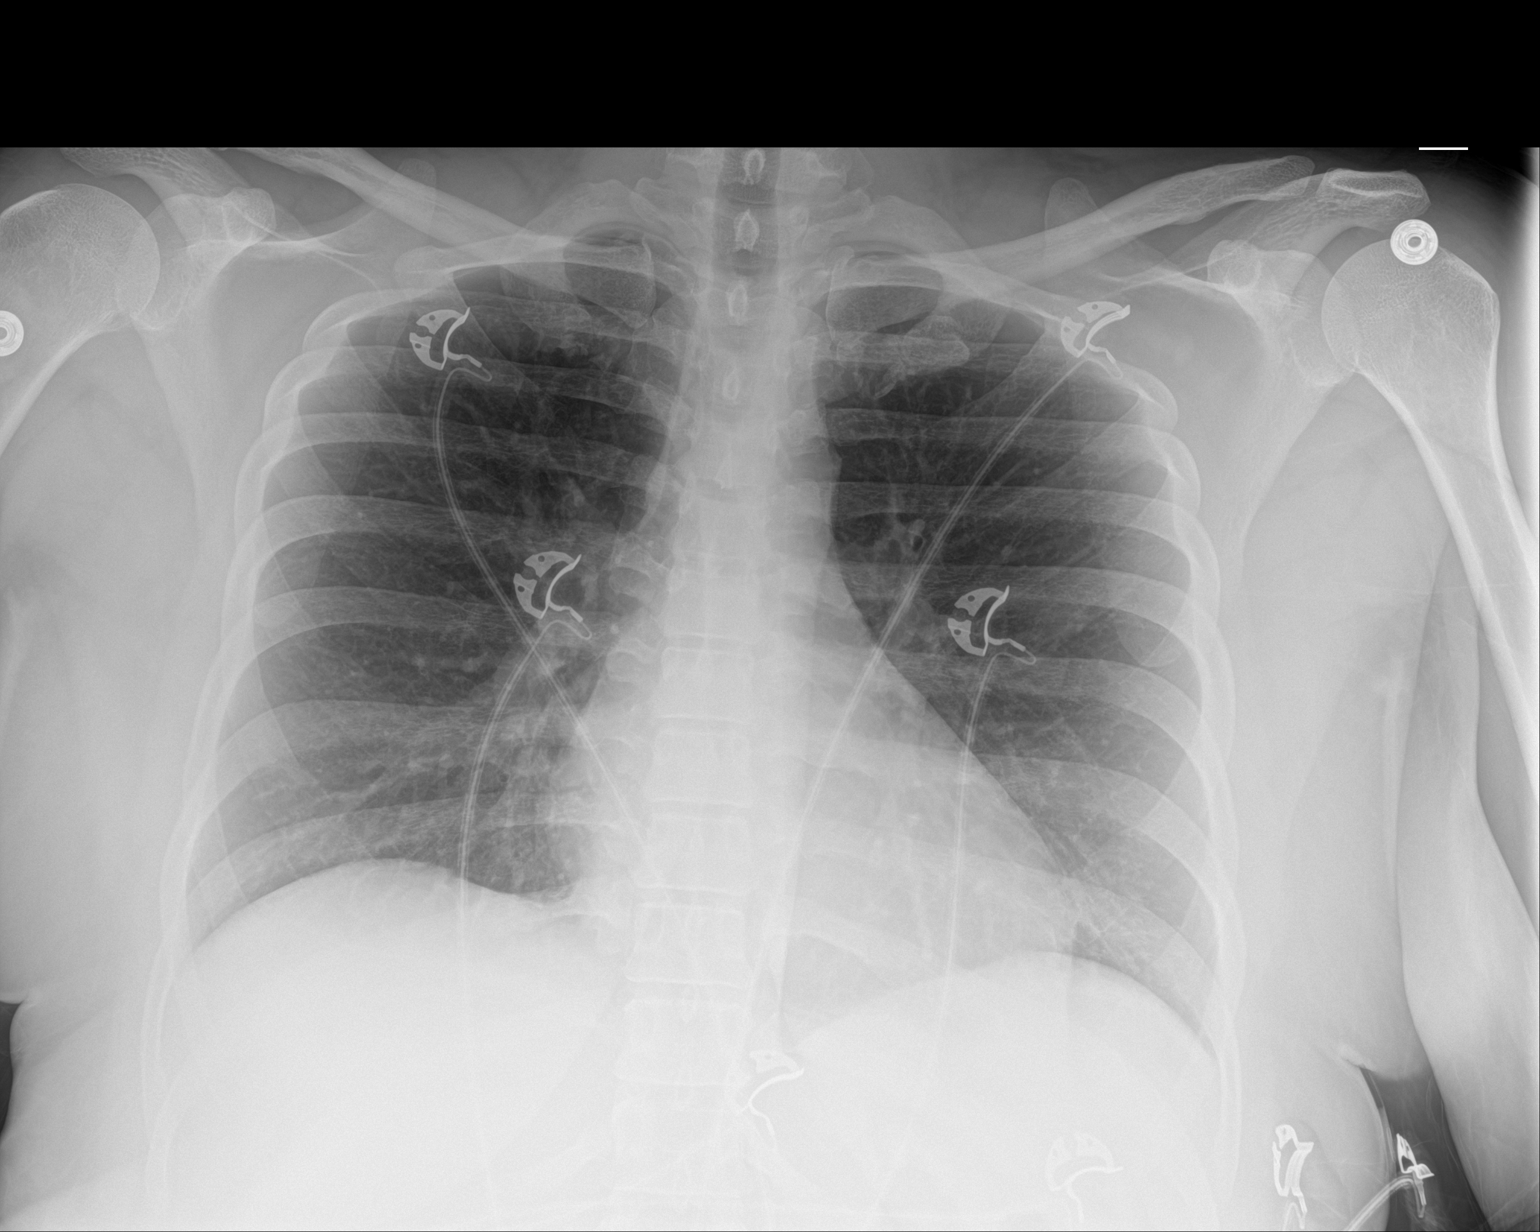

[1 of 1 positions shown; findings below may reference images not displayed]

FINDINGS: The heart size and mediastinal contours are within normal limits.
Both lungs are clear. The visualized skeletal structures are
unremarkable.
IMPRESSION: Normal examination.

## 2021-08-31 ENCOUNTER — Other Ambulatory Visit: Payer: Self-pay

## 2021-08-31 ENCOUNTER — Emergency Department (HOSPITAL_BASED_OUTPATIENT_CLINIC_OR_DEPARTMENT_OTHER)
Admission: EM | Admit: 2021-08-31 | Discharge: 2021-08-31 | Discharge status: Against Medical Advice | Payer: Medicaid Other | Attending: Emergency Medicine | Admitting: Emergency Medicine

## 2021-08-31 DIAGNOSIS — S31811A Laceration without foreign body of right buttock, initial encounter: Secondary | ICD-10-CM | POA: Diagnosis not present

## 2021-08-31 DIAGNOSIS — Y92410 Unspecified street and highway as the place of occurrence of the external cause: Secondary | ICD-10-CM | POA: Diagnosis not present

## 2021-08-31 DIAGNOSIS — Z5321 Procedure and treatment not carried out due to patient leaving prior to being seen by health care provider: Secondary | ICD-10-CM | POA: Insufficient documentation

## 2021-08-31 DIAGNOSIS — S3992XA Unspecified injury of lower back, initial encounter: Secondary | ICD-10-CM | POA: Diagnosis present

## 2021-08-31 NOTE — ED Notes (Signed)
Wound Irrigated with approximately 50-100 mL of NS in Triage. Dressed with Wet/Dry Gauze.  ?
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
## 2021-08-31 NOTE — ED Notes (Signed)
Pt notified registration that she is leaving.  

## 2021-09-01 ENCOUNTER — Encounter (HOSPITAL_COMMUNITY): Payer: Self-pay | Admitting: Emergency Medicine

## 2021-09-01 ENCOUNTER — Ambulatory Visit (HOSPITAL_COMMUNITY)
Admission: EM | Admit: 2021-09-01 | Discharge: 2021-09-01 | Disposition: A | Discharge status: Home / Self Care | Payer: Medicaid Other | Attending: Emergency Medicine | Admitting: Emergency Medicine

## 2021-09-01 DIAGNOSIS — S30810A Abrasion of lower back and pelvis, initial encounter: Secondary | ICD-10-CM

## 2021-09-01 MED ORDER — OXYCODONE HCL 5 MG PO TABS: 5.0000 mg | ORAL_TABLET | Freq: Four times a day (QID) | ORAL | 0 refills | Status: AC | PRN
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
## 2021-09-01 NOTE — Discharge Instructions (Signed)
Area should heal with time, such as increased swelling, increased pain, drainage, fever or chills, if these occur at any point please return for evaluation ? ?You may cleanse area daily with diluted soap and water with your normal hygiene, pat dry, cover with over-the-counter triple antibiotic cream and place Band-Aid, may discontinue wound care once area has begun to scab ? ?You may take a pain tablet every 6 hours as needed for severe pain ? ?You may place ice over the area for the next 24 hours to help reduce swelling then you may switch over to heat into the 15-minute intervals as tolerated ?

## 2021-09-21 ENCOUNTER — Other Ambulatory Visit: Payer: Self-pay | Admitting: Endocrinology

## 2021-09-21 DIAGNOSIS — Z1231 Encounter for screening mammogram for malignant neoplasm of breast: Secondary | ICD-10-CM

## 2021-10-19 ENCOUNTER — Ambulatory Visit
Admission: RE | Admit: 2021-10-19 | Discharge: 2021-10-19 | Disposition: A | Discharge status: Home / Self Care | Payer: Medicaid Other | Source: Ambulatory Visit | Attending: Endocrinology | Admitting: Endocrinology

## 2021-10-19 DIAGNOSIS — Z1231 Encounter for screening mammogram for malignant neoplasm of breast: Secondary | ICD-10-CM

## 2021-11-02 ENCOUNTER — Ambulatory Visit: Payer: Medicaid Other

## 2021-11-09 ENCOUNTER — Ambulatory Visit: Payer: Medicaid Other

## 2021-12-08 ENCOUNTER — Encounter (HOSPITAL_COMMUNITY): Payer: Self-pay | Admitting: *Deleted

## 2021-12-08 ENCOUNTER — Ambulatory Visit (HOSPITAL_COMMUNITY)
Admission: EM | Admit: 2021-12-08 | Discharge: 2021-12-08 | Disposition: A | Discharge status: Home / Self Care | Payer: Medicaid Other | Attending: Physician Assistant | Admitting: Physician Assistant

## 2021-12-08 DIAGNOSIS — N898 Other specified noninflammatory disorders of vagina: Secondary | ICD-10-CM | POA: Diagnosis present

## 2021-12-08 DIAGNOSIS — L02411 Cutaneous abscess of right axilla: Secondary | ICD-10-CM | POA: Diagnosis not present

## 2021-12-08 DIAGNOSIS — L0291 Cutaneous abscess, unspecified: Secondary | ICD-10-CM | POA: Insufficient documentation

## 2021-12-08 DIAGNOSIS — N760 Acute vaginitis: Secondary | ICD-10-CM | POA: Diagnosis present

## 2021-12-08 LAB — POC URINE PREG, ED: Preg Test, Ur: NEGATIVE

## 2021-12-08 LAB — POCT URINALYSIS DIPSTICK, ED / UC
Bilirubin Urine: NEGATIVE
Glucose, UA: NEGATIVE mg/dL
Ketones, ur: NEGATIVE mg/dL
Nitrite: NEGATIVE
Protein, ur: 30 mg/dL — AB
Specific Gravity, Urine: 1.025 (ref 1.005–1.030)
Urobilinogen, UA: 0.2 mg/dL (ref 0.0–1.0)
pH: 6 (ref 5.0–8.0)

## 2021-12-08 MED ORDER — FLUCONAZOLE 150 MG PO TABS: 150.0000 mg | ORAL_TABLET | Freq: Every day | ORAL | 1 refills | Status: DC

## 2021-12-08 MED ORDER — DOXYCYCLINE HYCLATE 100 MG PO CAPS: 100.0000 mg | ORAL_CAPSULE | Freq: Two times a day (BID) | ORAL | 0 refills | Status: DC

## 2021-12-08 MED ORDER — HYDROCODONE-ACETAMINOPHEN 5-325 MG PO TABS: 1.0000 | ORAL_TABLET | Freq: Four times a day (QID) | ORAL | 0 refills | Status: DC | PRN

## 2021-12-08 NOTE — ED Triage Notes (Signed)
C/O transient right axillary abscess over past yr. States started again 3 days ago with progressive worsening. Denies drainage.  Also c/o vaginal discomfort & swelling along with vaginal discharge and pruritis onset 6 days ago with worsening. Has tried Monistat and Vagisil without relief.

## 2021-12-08 NOTE — ED Provider Notes (Signed)
[facility]    CSN: [id number] Arrival date & time: 12/08/21  1502      History   Chief Complaint Chief Complaint  Patient presents with   Abscess   Vaginal Discharge    HPI Anna Calhoun is a 41 y.o. female.   41 year old female presents with abscess right axilla, vaginal discharge.  Patient relates for the past several days she has had an increasing abscess on the right axilla, painful and tender.  Patient relates the pain and discomfort for is increased for the past several days and the areas become red and irritated.  Patient relates she has not have any relief with Tylenol or Advil.  Patient relates she has been doing frequent warm compresses to the area, but relates there has been no drainage.  Patient relates no fever or chills. Patient also relates she has been having for the past several days vaginal itching, irritation, and discharge.  Patient relates that the discharge has been thin, without odor.  Patient indicates she has had some external itching and irritation, with soreness.  Patient indicates that she has used Monistat several times including today without relief.  Patient says she feels irritated inside the vaginal canal.  Patient indicates that her last menses was middle of June and it was normal.  Patient indicates that she had intercourse about 10 days ago and this was unprotected and not her usual partner.  Patient is having some increased frequency without dysuria, no lower back pain.   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    Past Surgical History:  Procedure Laterality Date   CESAREAN SECTION     CESAREAN SECTION N/A 02/04/2013   Procedure: CESAREAN  SECTION;  Surgeon: Kathreen Cosier, MD;  Location: [facility];  Service: Obstetrics;  Laterality: N/A;   CESAREAN SECTION N/A 06/08/2015   Procedure: CESAREAN SECTION;  Surgeon: Kathreen Cosier, MD;  Location: [facility];  Service: Obstetrics;  Laterality: N/A;   LEG SURGERY Left    ORIF    OB History     Gravida  6   Para  6   Term  5   Preterm  1   AB  0   Living  6      SAB      IAB      Ectopic      Multiple  0   Live Births  6            Home Medications    Prior to Admission medications   Medication Sig Start Date End Date Taking? Authorizing Provider  doxycycline (VIBRAMYCIN) 100 MG capsule Take 1 capsule (100 mg total) by mouth 2 (two) times daily. 12/08/21  Yes Ellsworth Lennox, PA-C  fluconazole (DIFLUCAN) 150 MG tablet Take 1 tablet (150 mg total) by mouth daily. May repeat in 5 days if needed. 12/08/21  Yes Ellsworth Lennox, PA-C  HYDROcodone-acetaminophen (NORCO/VICODIN) 5-325 MG tablet Take 1-2 tablets by mouth every 6 (six) hours as needed. 12/08/21  Yes Ellsworth Lennox, PA-C  acetaminophen (TYLENOL) 500 MG tablet Take 1,000 mg by mouth every 8 (eight) hours as needed for mild pain or moderate pain (body ache).  [provider]  aspirin EC 81 MG tablet Take 81 mg by mouth as needed for mild pain.    [provider]  benzonatate (TESSALON) 100 MG capsule Take 1 capsule (100 mg total) by mouth every 8 (eight) hours. Patient not taking: Reported on 02/20/2019 05/30/18   Lorin Glass, PA-C  cyclobenzaprine (FLEXERIL) 10 MG tablet Take 1 tablet (10 mg total) by mouth 2 (two) times daily as needed for muscle spasms. Patient not taking: Reported on 05/30/2018 07/12/17   Domenic Moras, PA-C  ibuprofen (ADVIL,MOTRIN) 600 MG tablet Take 1 tablet (600 mg total) by mouth every 6 (six) hours as needed for moderate pain. Patient not taking: Reported on 05/30/2018 07/12/17   Domenic Moras, PA-C    Family History Family History  Problem Relation Age of  Onset   Hypertension Mother    Diabetes Father    Cancer Maternal Grandfather    Cancer Paternal Grandmother     Social History Social History   Tobacco Use   Smoking status: Some Days    Packs/day: 0.15    Types: Cigarettes   Smokeless tobacco: Never  Vaping Use   Vaping Use: Never used  Substance Use Topics   Alcohol use: Yes    Alcohol/week: 2.0 standard drinks of alcohol    Types: 2 Glasses of wine per week   Drug use: Not Currently    Types: Cocaine    Comment: pt states last use "years ago"     Allergies   Patient has no known allergies.   Review of Systems Review of Systems  Genitourinary:  Positive for vaginal discharge.  Skin:  Positive for wound (abcess right underarm).     Physical Exam Triage Vital Signs ED Triage Vitals  Enc Vitals Group     BP 12/08/21 1547 133/82     Pulse Rate 12/08/21 1547 72     Resp 12/08/21 1547 16     Temp 12/08/21 1547 98.1 F (36.7 C)     Temp Source 12/08/21 1547 Oral     SpO2 12/08/21 1547 100 %     Weight --      Height --      Head Circumference --      Peak Flow --      Pain Score 12/08/21 1549 10     Pain Loc --      Pain Edu? --      Excl. in [place]? --    No data found.  Updated Vital Signs BP 133/82   Pulse 72   Temp 98.1 F (36.7 C) (Oral)   Resp 16   LMP 11/20/2021 (Approximate)   SpO2 100%   Visual Acuity Right Eye Distance:   Left Eye Distance:   Bilateral Distance:    Right Eye Near:   Left Eye Near:    Bilateral Near:     Physical Exam Constitutional:      Appearance: Normal appearance.  Cardiovascular:     Rate and Rhythm: Normal rate and regular rhythm.  Abdominal:     General: Abdomen is flat. Bowel sounds are normal.     Palpations: Abdomen is soft.     Tenderness: There is no abdominal tenderness.  Genitourinary:    Comments: Pelvic: The external introital area appears irritated, no sores or ulcerations present on the labia minora or majora, white cottage cheese type  discharge present externally.  The vaginal canal is red and irritated with cream-colored discharge, along with white cottage cheese discharge.  Skin:    Comments: Right axilla: There is a 2 x 3 cm cyst present with mild redness, fluid area noted at the center of the cyst without pointing.  There is pain on palpation. Procedure: The area was cleaned with Betadine, anesthetized with 1% Xylocaine, incised and purulent material expressed, no complications.  Neurological:     Mental Status: She is alert.      UC Treatments / Results  Labs (all labs ordered are listed, but only abnormal results are displayed) Labs Reviewed  POCT URINALYSIS DIPSTICK, ED / UC - Abnormal; Notable for the following components:      Result Value   Hgb urine dipstick TRACE (*)    Protein, ur 30 (*)    Leukocytes,Ua TRACE (*)    All other components within normal limits  URINE CULTURE  POC URINE PREG, ED  CERVICOVAGINAL ANCILLARY ONLY    EKG   Radiology No results found.  Procedures Procedures (including critical care time)  Medications Ordered in UC Medications - No data to display  Initial Impression / Assessment and Plan / UC Course  I have reviewed the triage vital signs and the nursing notes.  Pertinent labs & imaging results that were available during my care of the patient were reviewed by me and considered in my medical decision making (see chart for details).    Plan: 1.  STI testing is pending 2.  Advised the patient to take Diflucan 150 mg 1 tablet initially and then may repeat in 5 days if needed. 3.  Advised the patient to take Vicodin tablets 1-2 every 6-8 hours as needed for pain relief. 4.  Advised patient to take doxycycline 100 mg 1 twice daily until completed. 5.  Advised patient to follow-up with PCP or return to urgent care if symptoms fail to improve. Final Clinical Impressions(s) / UC Diagnoses   Final diagnoses:  Abscess  Acute vaginitis  Vaginal discharge      Discharge Instructions      Advised to use warm compresses frequently to the area to help increase the drainage. Advised to take Vicodin tablets 1-2 every 6-8 hours as needed for pain. Advised to take the Diflucan tablet 1 initially and then may repeat in 5 days if symptoms do not improve. Advised to take the doxycycline 100 mg 1 capsule every 12 hours until completed. Advised to follow-up with PCP or return to urgent care if symptoms fail to improve.    ED Prescriptions     Medication Sig Dispense Auth. Provider   HYDROcodone-acetaminophen (NORCO/VICODIN) 5-325 MG tablet Take 1-2 tablets by mouth every 6 (six) hours as needed. 18 tablet Ellsworth Lennox, PA-C   fluconazole (DIFLUCAN) 150 MG tablet Take 1 tablet (150 mg total) by mouth daily. May repeat in 5 days if needed. 2 tablet Ellsworth Lennox, PA-C   doxycycline (VIBRAMYCIN) 100 MG capsule Take 1 capsule (100 mg total) by mouth 2 (two) times daily. 20 capsule Ellsworth Lennox, PA-C      I have reviewed the PDMP during this encounter.   Ellsworth Lennox, PA-C 12/08/21 1644

## 2021-12-08 NOTE — Discharge Instructions (Addendum)
Advised to use warm compresses frequently to the area to help increase the drainage. Advised to take Vicodin tablets 1-2 every 6-8 hours as needed for pain. Advised to take the Diflucan tablet 1 initially and then may repeat in 5 days if symptoms do not improve. Advised to take the doxycycline 100 mg 1 capsule every 12 hours until completed. Advised to follow-up with PCP or return to urgent care if symptoms fail to improve.

## 2021-12-09 LAB — URINE CULTURE

## 2021-12-09 LAB — CERVICOVAGINAL ANCILLARY ONLY
Bacterial Vaginitis (gardnerella): NEGATIVE
Candida Glabrata: NEGATIVE
Candida Vaginitis: POSITIVE — AB
Chlamydia: NEGATIVE
Comment: NEGATIVE
Comment: NEGATIVE
Comment: NEGATIVE
Comment: NEGATIVE
Comment: NEGATIVE
Comment: NORMAL
Neisseria Gonorrhea: NEGATIVE
Trichomonas: NEGATIVE

## 2022-02-07 ENCOUNTER — Encounter (HOSPITAL_COMMUNITY): Payer: Self-pay

## 2022-02-07 ENCOUNTER — Ambulatory Visit (HOSPITAL_COMMUNITY)
Admission: EM | Admit: 2022-02-07 | Discharge: 2022-02-07 | Disposition: A | Discharge status: Home / Self Care | Payer: Medicaid Other | Attending: Internal Medicine | Admitting: Internal Medicine

## 2022-02-07 DIAGNOSIS — J36 Peritonsillar abscess: Secondary | ICD-10-CM

## 2022-02-07 DIAGNOSIS — J029 Acute pharyngitis, unspecified: Secondary | ICD-10-CM | POA: Diagnosis not present

## 2022-02-07 DIAGNOSIS — K2100 Gastro-esophageal reflux disease with esophagitis, without bleeding: Secondary | ICD-10-CM

## 2022-02-07 DIAGNOSIS — H66001 Acute suppurative otitis media without spontaneous rupture of ear drum, right ear: Secondary | ICD-10-CM | POA: Diagnosis not present

## 2022-02-07 LAB — POCT RAPID STREP A, ED / UC: Streptococcus, Group A Screen (Direct): NEGATIVE

## 2022-02-07 MED ORDER — LANSOPRAZOLE 30 MG PO CPDR: 30.0000 mg | DELAYED_RELEASE_CAPSULE | Freq: Two times a day (BID) | ORAL | 0 refills | Status: AC

## 2022-02-07 MED ORDER — CEFTRIAXONE SODIUM 1 G IJ SOLR
INTRAMUSCULAR | Status: AC
  Filled 2022-02-07: qty 10

## 2022-02-07 MED ORDER — CEFTRIAXONE SODIUM 1 G IJ SOLR
1.0000 g | Freq: Once | INTRAMUSCULAR | Status: AC
  Administered 2022-02-07: 1 g via INTRAMUSCULAR

## 2022-02-07 MED ORDER — AMOXICILLIN-POT CLAVULANATE 875-125 MG PO TABS: 1.0000 | ORAL_TABLET | Freq: Two times a day (BID) | ORAL | 0 refills | Status: AC

## 2022-02-07 MED ORDER — LIDOCAINE HCL (PF) 1 % IJ SOLN
INTRAMUSCULAR | Status: AC
  Filled 2022-02-07: qty 2

## 2022-02-07 NOTE — ED Triage Notes (Signed)
Patient having dizziness, heart burn, and sore throat onset Saturday.   No known sick exposure, no one at home with similar symptoms. States one coworker was not feeling well on Friday but all their test came back negative.   States has a hard time swallowing, is painful. States feels like there is something hard stuck when trying to swallow.

## 2022-02-07 NOTE — ED Provider Notes (Signed)
[facility]    CSN: [id number] Arrival date & time: 02/07/22  1524    HISTORY   Chief Complaint  Patient presents with   Sore Throat   Gastroesophageal Reflux   HPI Anna Calhoun is a pleasant, 41 y.o. female who presents to urgent care today. Patient having dizziness, heart burn, and sore throat onset 3 days ago.  Denies known sick exposure, no one at home with similar symptoms. States one coworker was not feeling well on Friday but all their test came back negative.   States has a hard time swallowing, is painful. States feels like there is something in the back of her throat when trying to swallow.   The history is provided by the patient.   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   Past Surgical History:  Procedure Laterality Date   CESAREAN SECTION     CESAREAN SECTION N/A 02/04/2013   Procedure: CESAREAN SECTION;  Surgeon: Kathreen Cosier, MD;  Location: [facility];  Service: Obstetrics;  Laterality: N/A;   CESAREAN SECTION N/A 06/08/2015   Procedure: CESAREAN SECTION;  Surgeon: Kathreen Cosier, MD;  Location: [facility];  Service: Obstetrics;  Laterality: N/A;   LEG SURGERY Left    ORIF   OB History     Gravida  6   Para  6   Term  5   Preterm  1   AB  0   Living  6      SAB      IAB      Ectopic      Multiple  0   Live Births  6          Home Medications    Prior to Admission medications   Medication Sig Start Date End Date Taking? Authorizing Provider  acetaminophen (TYLENOL) 500 MG tablet Take 1,000 mg by mouth every 8 (eight) hours as needed for mild pain or moderate pain (body ache).    [provider]  aspirin EC 81 MG tablet Take 81 mg by mouth as needed  for mild pain.    [provider]  benzonatate (TESSALON) 100 MG capsule Take 1 capsule (100 mg total) by mouth every 8 (eight) hours. Patient not taking: Reported on 02/20/2019 05/30/18   Cristina Gong, PA-C  cyclobenzaprine (FLEXERIL) 10 MG tablet Take 1 tablet (10 mg total) by mouth 2 (two) times daily as needed for muscle spasms. Patient not taking: Reported on 05/30/2018 07/12/17   Fayrene Helper, PA-C  doxycycline (VIBRAMYCIN) 100 MG capsule Take 1 capsule (100 mg total) by mouth 2 (two) times daily. 12/08/21   Ellsworth Lennox, PA-C  fluconazole (DIFLUCAN) 150 MG tablet Take 1 tablet (150 mg total) by mouth daily. May repeat in 5 days if needed. 12/08/21   Ellsworth Lennox, PA-C  HYDROcodone-acetaminophen (NORCO/VICODIN) 5-325 MG tablet Take 1-2 tablets by mouth every 6 (six) hours as needed. 12/08/21   Ellsworth Lennox, PA-C  ibuprofen (ADVIL,MOTRIN) 600 MG tablet Take 1 tablet (600 mg total) by mouth every 6 (six) hours as needed for moderate pain. Patient not taking: Reported on 05/30/2018 07/12/17   Fayrene Helper, PA-C    Family History Family History  Problem Relation Age of Onset  Hypertension Mother    Diabetes Father    Cancer Maternal Grandfather    Cancer Paternal Grandmother    Social History Social History   Tobacco Use   Smoking status: Some Days    Packs/day: 0.15    Types: Cigarettes   Smokeless tobacco: Never  Vaping Use   Vaping Use: Never used  Substance Use Topics   Alcohol use: Yes    Alcohol/week: 2.0 standard drinks of alcohol    Types: 2 Glasses of wine per week   Drug use: Not Currently    Types: Cocaine    Comment: pt states last use "years ago"   Allergies   Patient has no known allergies.  Review of Systems Review of Systems Pertinent findings revealed after performing a 14 point review of systems has been noted in the history of present illness.  Physical Exam Triage Vital Signs ED Triage Vitals  Enc Vitals Group     BP 04/08/21 0827 (!)  147/82     Pulse Rate 04/08/21 0827 72     Resp 04/08/21 0827 18     Temp 04/08/21 0827 98.3 F (36.8 C)     Temp Source 04/08/21 0827 Oral     SpO2 04/08/21 0827 98 %     Weight --      Height --      Head Circumference --      Peak Flow --      Pain Score 04/08/21 0826 5     Pain Loc --      Pain Edu? --      Excl. in [place]? --   No data found.  Updated Vital Signs BP 119/82 (BP Location: Right Arm)   Pulse 74   Temp 98 F (36.7 C) (Oral)   Resp 16   Ht 5' (1.524 m)   Wt 191 lb (86.6 kg)   LMP 12/20/2021 (Exact Date)   SpO2 99%   BMI 37.30 kg/m   Physical Exam Vitals and nursing note reviewed.  Constitutional:      General: She is not in acute distress.    Appearance: Normal appearance. She is well-developed. She is obese. She is not ill-appearing.  HENT:     Head: Normocephalic and atraumatic.     Salivary Glands: Right salivary gland is not diffusely enlarged or tender. Left salivary gland is diffusely enlarged and tender.     Right Ear: Hearing and external ear normal. No drainage. No middle ear effusion. There is no impacted cerumen. Tympanic membrane is injected, erythematous and retracted. Tympanic membrane is not perforated or bulging.     Left Ear: Hearing, tympanic membrane, ear canal and external ear normal. No drainage.  No middle ear effusion. There is no impacted cerumen. Tympanic membrane is not erythematous or bulging.     Nose: Mucosal edema and congestion present. No nasal deformity, septal deviation or rhinorrhea.     Right Turbinates: Swollen. Not enlarged or pale.     Left Turbinates: Swollen. Not enlarged or pale.     Right Sinus: No maxillary sinus tenderness or frontal sinus tenderness.     Left Sinus: No maxillary sinus tenderness or frontal sinus tenderness.     Mouth/Throat:     Lips: Pink. No lesions.     Mouth: Mucous membranes are moist. No oral lesions.     Pharynx: Pharyngeal swelling (Left-sided), posterior oropharyngeal erythema and  uvula swelling present.     Tonsils: No tonsillar exudate. 0 on the right. 0  on the left.  Eyes:     General: Lids are normal.        Right eye: No discharge.        Left eye: No discharge.     Extraocular Movements: Extraocular movements intact.     Conjunctiva/sclera: Conjunctivae normal.     Right eye: Right conjunctiva is not injected.     Left eye: Left conjunctiva is not injected.  Neck:     Trachea: Trachea and phonation normal.  Cardiovascular:     Rate and Rhythm: Normal rate and regular rhythm.     Pulses: Normal pulses.     Heart sounds: Normal heart sounds. No murmur heard.    No friction rub. No gallop.  Pulmonary:     Effort: Pulmonary effort is normal. No accessory muscle usage, prolonged expiration or respiratory distress.     Breath sounds: Normal breath sounds. No stridor, decreased air movement or transmitted upper airway sounds. No decreased breath sounds, wheezing, rhonchi or rales.  Chest:     Chest wall: No tenderness.  Abdominal:     General: Abdomen is flat. Bowel sounds are normal.     Palpations: Abdomen is soft.     Tenderness: There is abdominal tenderness in the epigastric area.  Musculoskeletal:        General: Normal range of motion.     Cervical back: Normal range of motion and neck supple. Normal range of motion.  Lymphadenopathy:     Cervical: No cervical adenopathy.  Skin:    General: Skin is warm and dry.     Findings: No erythema or rash.  Neurological:     General: No focal deficit present.     Mental Status: She is alert and oriented to person, place, and time.  Psychiatric:        Mood and Affect: Mood normal.        Behavior: Behavior normal.     Visual Acuity Right Eye Distance:   Left Eye Distance:   Bilateral Distance:    Right Eye Near:   Left Eye Near:    Bilateral Near:     UC Couse / Diagnostics / Procedures:     Radiology No results found.  Procedures Procedures (including critical care time) EKG  Pending  results:  Labs Reviewed  POCT RAPID STREP A, ED / UC    Medications Ordered in UC: Medications  cefTRIAXone (ROCEPHIN) injection 1 g (has no administration in time range)    UC Diagnoses / Final Clinical Impressions(s)   I have reviewed the triage vital signs and the nursing notes.  Pertinent labs & imaging results that were available during my care of the patient were reviewed by me and considered in my medical decision making (see chart for details).    Final diagnoses:  Pharyngitis, unspecified etiology  Peritonsillar cellulitis  Acute suppurative otitis media of right ear  Gastroesophageal reflux disease with esophagitis without hemorrhage   Patient provided with lansoprazole to take twice daily before meals until feeling better than to continue once daily until full treatment course is complete.  Patient provided with Augmentin for 7 days after receiving a gram of ceftriaxone.  Ceftriaxone provided due to patient having GI upset and wanting to limit duration of Augmentin.  Return precautions advised.  ED Prescriptions     Medication Sig Dispense Auth. Provider   amoxicillin-clavulanate (AUGMENTIN) 875-125 MG tablet Take 1 tablet by mouth 2 (two) times daily for 7 days. 14 tablet Theadora Rama Scales,  PA-C   lansoprazole (PREVACID) 30 MG capsule Take 1 capsule (30 mg total) by mouth 2 (two) times daily before a meal. 30 minutes prior to breakfast meal 180 capsule Theadora Rama Scales, PA-C      PDMP not reviewed this encounter.  Pending results:  Labs Reviewed  POCT RAPID STREP A, ED / UC    Discharge Instructions:   Discharge Instructions      To treat the infection in your right ear and around your left tonsil, please begin Augmentin 1 tablet twice daily for the next 7 days.  You received an injection of ceftriaxone to expedite this process during your visit today.  For your heartburn, I have sent a prescription for Prevacid to your pharmacy.  Please take 1  capsule twice daily before meals.  Once you feel that your heartburn is significantly improved, please decrease to once daily and be sure that you finish a full 32-month course of this medication.  I apologize for offering you a prescription for Carafate, it is not covered by Medicaid.  You are welcome to continue using Tums or baking soda to relieve your more immediate heartburn symptoms while you are waiting for Prevacid to take full effect.  Prevacid should begin providing you with meaningful relief within 2 to 3 days.  Thank you for visiting urgent care today.  Please let us know if these recommendations have not resolved her issues.      Disposition Upon Discharge:  Condition: stable for discharge home  Patient presented with an acute illness with associated systemic symptoms and significant discomfort requiring urgent management. In my opinion, this is a condition that a prudent lay person (someone who possesses an average knowledge of health and medicine) may potentially expect to result in complications if not addressed urgently such as respiratory distress, impairment of bodily function or dysfunction of bodily organs.   Routine symptom specific, illness specific and/or disease specific instructions were discussed with the patient and/or caregiver at length.   As such, the patient has been evaluated and assessed, work-up was performed and treatment was provided in alignment with urgent care protocols and evidence based medicine.  Patient/parent/caregiver has been advised that the patient may require follow up for further testing and treatment if the symptoms continue in spite of treatment, as clinically indicated and appropriate.  Patient/parent/caregiver has been advised to return to the [facility] or PCP if no better; to PCP or the Emergency Department if new signs and symptoms develop, or if the current signs or symptoms continue to change or worsen for further workup, evaluation and treatment  as clinically indicated and appropriate  The patient will follow up with their current PCP if and as advised. If the patient does not currently have a PCP we will assist them in obtaining one.   The patient may need specialty follow up if the symptoms continue, in spite of conservative treatment and management, for further workup, evaluation, consultation and treatment as clinically indicated and appropriate.   Patient/parent/caregiver verbalized understanding and agreement of plan as discussed.  All questions were addressed during visit.  Please see discharge instructions below for further details of plan.  This office note has been dictated using Teaching laboratory technician.  Unfortunately, this method of dictation can sometimes lead to typographical or grammatical errors.  I apologize for your inconvenience in advance if this occurs.  Please do not hesitate to reach out to me if clarification is needed.      Theadora Rama Scales,  PA-C 02/07/22 1718

## 2022-02-07 NOTE — Discharge Instructions (Signed)
To treat the infection in your right ear and around your left tonsil, please begin Augmentin 1 tablet twice daily for the next 7 days.  You received an injection of ceftriaxone to expedite this process during your visit today.  For your heartburn, I have sent a prescription for Prevacid to your pharmacy.  Please take 1 capsule twice daily before meals.  Once you feel that your heartburn is significantly improved, please decrease to once daily and be sure that you finish a full 40-month course of this medication.  I apologize for offering you a prescription for Carafate, it is not covered by Medicaid.  You are welcome to continue using Tums or baking soda to relieve your more immediate heartburn symptoms while you are waiting for Prevacid to take full effect.  Prevacid should begin providing you with meaningful relief within 2 to 3 days.  Thank you for visiting urgent care today.  Please let us know if these recommendations have not resolved her issues.

## 2023-02-06 ENCOUNTER — Encounter (HOSPITAL_COMMUNITY): Payer: Self-pay

## 2023-02-06 ENCOUNTER — Ambulatory Visit (HOSPITAL_COMMUNITY)
Admission: EM | Admit: 2023-02-06 | Discharge: 2023-02-06 | Disposition: A | Discharge status: Home / Self Care | Payer: MEDICAID | Attending: Family Medicine | Admitting: Family Medicine

## 2023-02-06 ENCOUNTER — Ambulatory Visit (INDEPENDENT_AMBULATORY_CARE_PROVIDER_SITE_OTHER): Payer: MEDICAID

## 2023-02-06 DIAGNOSIS — M7989 Other specified soft tissue disorders: Secondary | ICD-10-CM | POA: Diagnosis not present

## 2023-02-06 DIAGNOSIS — M25571 Pain in right ankle and joints of right foot: Secondary | ICD-10-CM | POA: Diagnosis not present

## 2023-02-06 MED ORDER — IBUPROFEN 800 MG PO TABS: 800.0000 mg | ORAL_TABLET | Freq: Three times a day (TID) | ORAL | 0 refills | Status: AC | PRN

## 2023-02-06 NOTE — Discharge Instructions (Signed)
By my review, you do not have any new abnormalities on your x-rays.  I can see signs of your old fracture injury.  The radiologist will also read your x-rays and if their interpretation differs significantly from mine, we will call you.  Take ibuprofen 800 mg--1 tab every 8 hours as needed for pain.  It may be that inserts in your shoes could make you feel better

## 2023-02-06 NOTE — ED Triage Notes (Signed)
Pt c/o pain to heel of rt foot x1 week. Denies injury. States started when she started working again. States using OTC meds with little relief.

## 2023-02-06 NOTE — ED Provider Notes (Signed)
[facility]    CSN: [id number] Arrival date & time: 02/06/23  1102      History   Chief Complaint Chief Complaint  Patient presents with   Foot Pain    HPI Anna Calhoun is a 42 y.o. female.    Foot Pain  Here for right medial ankle and foot pain.  It has been hurting in the last week since she started working again.  She states her work shoes are good and supportive.  No recent injury or trauma, but she does have an history of an ankle fracture on the right previously.    This is when I saw also because this is that 1 now it is okay.  Yeah   She does have some puffiness along the edge of her instep.  On the right foot.   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    Past Surgical History:  Procedure Laterality Date   CESAREAN SECTION     CESAREAN SECTION N/A 02/04/2013   Procedure: CESAREAN SECTION;  Surgeon: Kathreen Cosier, MD;  Location: [facility];  Service: Obstetrics;  Laterality: N/A;   CESAREAN SECTION N/A 06/08/2015   Procedure: CESAREAN SECTION;  Surgeon: Kathreen Cosier, MD;  Location: [facility];  Service: Obstetrics;  Laterality: N/A;   LEG SURGERY Left    ORIF    OB History     Gravida  6   Para  6   Term  5   Preterm  1   AB  0   Living  6      SAB      IAB      Ectopic      Multiple  0   Live Births  6            Home Medications    Prior to Admission medications   Medication Sig Start Date End Date Taking? Authorizing Provider  ibuprofen (ADVIL) 800 MG tablet Take 1 tablet (800 mg total) by mouth every 8 (eight) hours as needed (pain). 02/06/23  Yes Zenia Resides, MD  acetaminophen (TYLENOL) 500 MG tablet  Take 1,000 mg by mouth every 8 (eight) hours as needed for mild pain or moderate pain (body ache).    [provider]  aspirin EC 81 MG tablet Take 81 mg by mouth as needed for mild pain.    [provider]  lansoprazole (PREVACID) 30 MG capsule Take 1 capsule (30 mg total) by mouth 2 (two) times daily before a meal. 30 minutes prior to breakfast meal 02/07/22 05/08/22  Theadora Rama Scales, PA-C    Family History Family History  Problem Relation Age of Onset   Hypertension Mother    Diabetes Father    Cancer Maternal Grandfather    Cancer Paternal Grandmother     Social History Social History   Tobacco Use   Smoking status: Some Days    Current packs/day: 0.15    Types: Cigarettes   Smokeless tobacco: Never  Vaping Use   Vaping status: Never Used  Substance Use Topics   Alcohol use: Yes    Alcohol/week: 2.0 standard drinks of alcohol    Types:  2 Glasses of wine per week   Drug use: Not Currently    Types: Cocaine    Comment: pt states last use "years ago"     Allergies   Patient has no known allergies.   Review of Systems Review of Systems   Physical Exam Triage Vital Signs ED Triage Vitals  Encounter Vitals Group     BP 02/06/23 1211 (!) 126/55     Systolic BP Percentile --      Diastolic BP Percentile --      Pulse Rate 02/06/23 1211 83     Resp 02/06/23 1211 18     Temp 02/06/23 1211 98.2 F (36.8 C)     Temp Source 02/06/23 1211 Oral     SpO2 02/06/23 1211 97 %     Weight --      Height --      Head Circumference --      Peak Flow --      Pain Score 02/06/23 1212 7     Pain Loc --      Pain Education --      Exclude from Growth Chart --    No data found.  Updated Vital Signs BP (!) 126/55 (BP Location: Right Arm)   Pulse 83   Temp 98.2 F (36.8 C) (Oral)   Resp 18   LMP 01/29/2023   SpO2 97%   Visual Acuity Right Eye Distance:   Left Eye Distance:   Bilateral Distance:    Right Eye Near:   Left Eye Near:     Bilateral Near:     Physical Exam Vitals reviewed.  Constitutional:      General: She is not in acute distress.    Appearance: She is not ill-appearing, toxic-appearing or diaphoretic.  Musculoskeletal:     Comments: There is some mild tenderness of the medial malleolus on the right ankle.  There is a little swelling of the soft tissues inferior to the medial malleolus.  No erythema and no induration.  Where she is swelling she is little tender also  Skin:    Coloration: Skin is not pale.  Neurological:     Mental Status: She is alert and oriented to person, place, and time.  Psychiatric:        Behavior: Behavior normal.      UC Treatments / Results  Labs (all labs ordered are listed, but only abnormal results are displayed) Labs Reviewed - No data to display  EKG   Radiology No results found.  Procedures Procedures (including critical care time)  Medications Ordered in UC Medications - No data to display  Initial Impression / Assessment and Plan / UC Course  I have reviewed the triage vital signs and the nursing notes.  Pertinent labs & imaging results that were available during my care of the patient were reviewed by me and considered in my medical decision making (see chart for details).        By my review the x-ray does show changes consistent with old fracture and injury.  I do not see any acute bony pathology.  Ibuprofen 800 mg is sent in an Ace wrap is applied.  She is advised of radiology over read. Final Clinical Impressions(s) / UC Diagnoses   Final diagnoses:  Acute right ankle pain  Swelling of right foot     Discharge Instructions      By my review, you do not have any new abnormalities on your x-rays.  I can  see signs of your old fracture injury.  The radiologist will also read your x-rays and if their interpretation differs significantly from mine, we will call you.  Take ibuprofen 800 mg--1 tab every 8 hours as needed for pain.  It  may be that inserts in your shoes could make you feel better     ED Prescriptions     Medication Sig Dispense Auth. Provider   ibuprofen (ADVIL) 800 MG tablet Take 1 tablet (800 mg total) by mouth every 8 (eight) hours as needed (pain). 21 tablet Tanieka Pownall, Janace Aris, MD      PDMP not reviewed this encounter.   Zenia Resides, MD 02/06/23 [phone]
